# Patient Record
Sex: Female | Born: 1935 | Race: White | Hispanic: No | Smoking: Never smoker
Health system: Southern US, Community
[De-identification: ages and names within clinical notes are randomized; demographics above are authoritative.]

## PROBLEM LIST (undated history)

## (undated) DIAGNOSIS — M1990 Unspecified osteoarthritis, unspecified site: Secondary | ICD-10-CM

## (undated) DIAGNOSIS — E079 Disorder of thyroid, unspecified: Secondary | ICD-10-CM

## (undated) DIAGNOSIS — E7800 Pure hypercholesterolemia, unspecified: Secondary | ICD-10-CM

## (undated) DIAGNOSIS — M549 Dorsalgia, unspecified: Secondary | ICD-10-CM

## (undated) DIAGNOSIS — N390 Urinary tract infection, site not specified: Secondary | ICD-10-CM

## (undated) DIAGNOSIS — I10 Essential (primary) hypertension: Secondary | ICD-10-CM

## (undated) DIAGNOSIS — R102 Pelvic and perineal pain: Secondary | ICD-10-CM

## (undated) HISTORY — DX: Urinary tract infection, site not specified: N39.0

## (undated) HISTORY — DX: Unspecified osteoarthritis, unspecified site: M19.90

## (undated) HISTORY — PX: CHOLECYSTECTOMY: SHX55

## (undated) HISTORY — PX: OTHER SURGICAL HISTORY: SHX169

## (undated) HISTORY — DX: Pelvic and perineal pain: R10.2

## (undated) HISTORY — DX: Dorsalgia, unspecified: M54.9

---

## 2001-12-02 ENCOUNTER — Encounter: Admission: RE | Admit: 2001-12-02 | Discharge: 2002-03-02 | Payer: Self-pay | Admitting: Internal Medicine

## 2002-08-26 ENCOUNTER — Encounter: Payer: Self-pay | Admitting: Hematology and Oncology

## 2002-08-26 ENCOUNTER — Ambulatory Visit (HOSPITAL_COMMUNITY): Admission: RE | Admit: 2002-08-26 | Discharge: 2002-08-26 | Payer: Self-pay | Admitting: Hematology and Oncology

## 2003-06-30 ENCOUNTER — Ambulatory Visit (HOSPITAL_COMMUNITY): Admission: RE | Admit: 2003-06-30 | Discharge: 2003-06-30 | Payer: Self-pay | Admitting: Hematology and Oncology

## 2003-09-17 ENCOUNTER — Ambulatory Visit (HOSPITAL_COMMUNITY): Admission: RE | Admit: 2003-09-17 | Discharge: 2003-09-17 | Payer: Self-pay | Admitting: Internal Medicine

## 2003-10-29 ENCOUNTER — Ambulatory Visit (HOSPITAL_COMMUNITY): Admission: RE | Admit: 2003-10-29 | Discharge: 2003-10-29 | Payer: Self-pay | Admitting: Internal Medicine

## 2004-07-26 ENCOUNTER — Ambulatory Visit (HOSPITAL_COMMUNITY): Admission: RE | Admit: 2004-07-26 | Discharge: 2004-07-26 | Payer: Self-pay | Admitting: Hematology and Oncology

## 2005-02-27 ENCOUNTER — Encounter (HOSPITAL_COMMUNITY): Admission: RE | Admit: 2005-02-27 | Discharge: 2005-03-02 | Payer: Self-pay | Admitting: Oncology

## 2005-02-27 ENCOUNTER — Ambulatory Visit (HOSPITAL_COMMUNITY): Payer: Self-pay | Admitting: Oncology

## 2005-06-11 ENCOUNTER — Ambulatory Visit (HOSPITAL_COMMUNITY): Admission: RE | Admit: 2005-06-11 | Discharge: 2005-06-11 | Payer: Self-pay | Admitting: Family Medicine

## 2005-07-24 ENCOUNTER — Encounter (INDEPENDENT_AMBULATORY_CARE_PROVIDER_SITE_OTHER): Payer: Self-pay | Admitting: Specialist

## 2005-07-25 ENCOUNTER — Inpatient Hospital Stay (HOSPITAL_COMMUNITY): Admission: RE | Admit: 2005-07-25 | Discharge: 2005-08-09 | Payer: Self-pay

## 2005-07-25 ENCOUNTER — Ambulatory Visit: Payer: Self-pay | Admitting: Physical Medicine & Rehabilitation

## 2005-07-30 ENCOUNTER — Encounter (INDEPENDENT_AMBULATORY_CARE_PROVIDER_SITE_OTHER): Payer: Self-pay | Admitting: Cardiology

## 2005-09-26 ENCOUNTER — Encounter: Admission: RE | Admit: 2005-09-26 | Discharge: 2005-09-26 | Payer: Self-pay | Admitting: Oncology

## 2005-09-26 ENCOUNTER — Encounter (HOSPITAL_COMMUNITY): Admission: RE | Admit: 2005-09-26 | Discharge: 2005-10-26 | Payer: Self-pay | Admitting: Oncology

## 2005-10-08 ENCOUNTER — Ambulatory Visit (HOSPITAL_COMMUNITY): Admission: RE | Admit: 2005-10-08 | Discharge: 2005-10-08 | Payer: Self-pay | Admitting: Family Medicine

## 2006-10-09 ENCOUNTER — Encounter (HOSPITAL_COMMUNITY): Admission: RE | Admit: 2006-10-09 | Discharge: 2006-11-08 | Payer: Self-pay | Admitting: Oncology

## 2007-03-10 ENCOUNTER — Ambulatory Visit (HOSPITAL_COMMUNITY): Admission: RE | Admit: 2007-03-10 | Discharge: 2007-03-10 | Payer: Self-pay | Admitting: Family Medicine

## 2007-11-03 ENCOUNTER — Encounter (HOSPITAL_COMMUNITY): Admission: RE | Admit: 2007-11-03 | Discharge: 2007-12-03 | Payer: Self-pay | Admitting: Oncology

## 2007-12-28 IMAGING — US US ABDOMEN COMPLETE
1 series · 14 of 25 positions shown · non-contrast
Comparison: None available.

CLINICAL DATA: Right flank and abdominal pain.
 ABDOMEN ULTRASOUND COMPLETE ? 06/11/05:
TECHNIQUE: Complete abdominal ultrasound examination was performed including evaluation of the liver, gallbladder, bile ducts, pancreas, kidneys, spleen, IVC, and abdominal aorta.

[Series 1: unknown · 0.34mm/px · 14 of 80 slices shown]
[im 1/80]
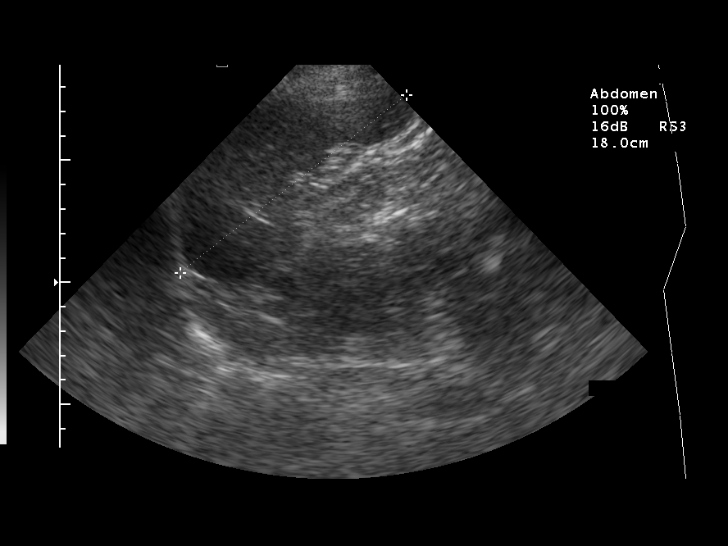
[im 7/80]
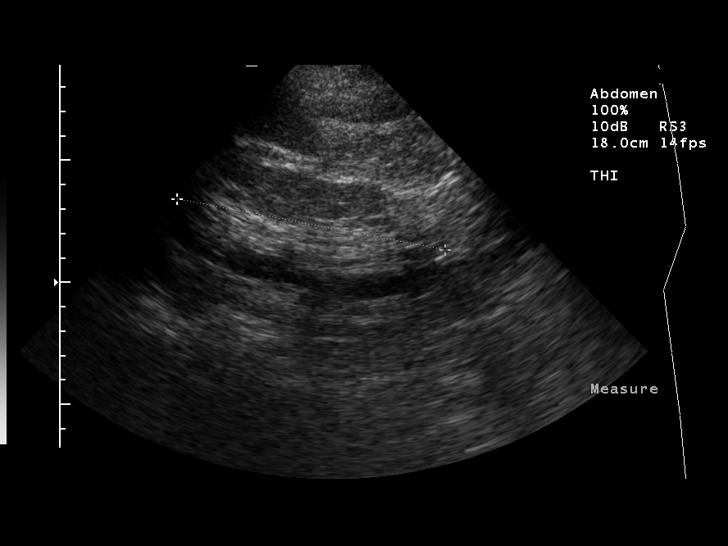
[im 14/80]
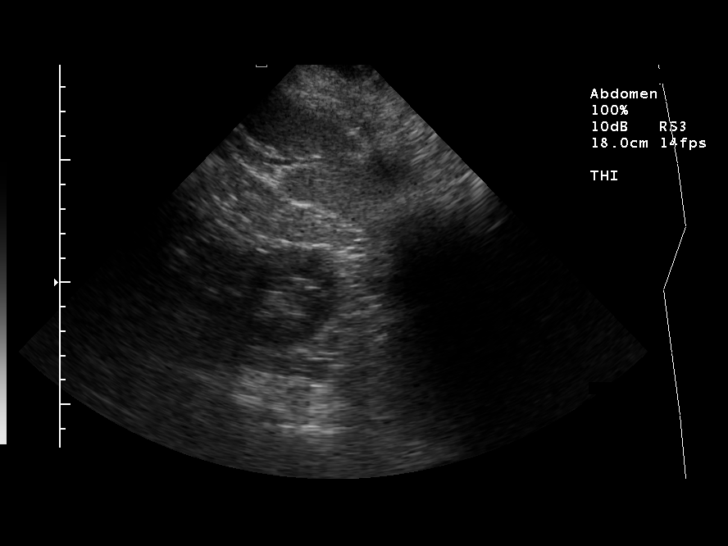
[im 20/80]
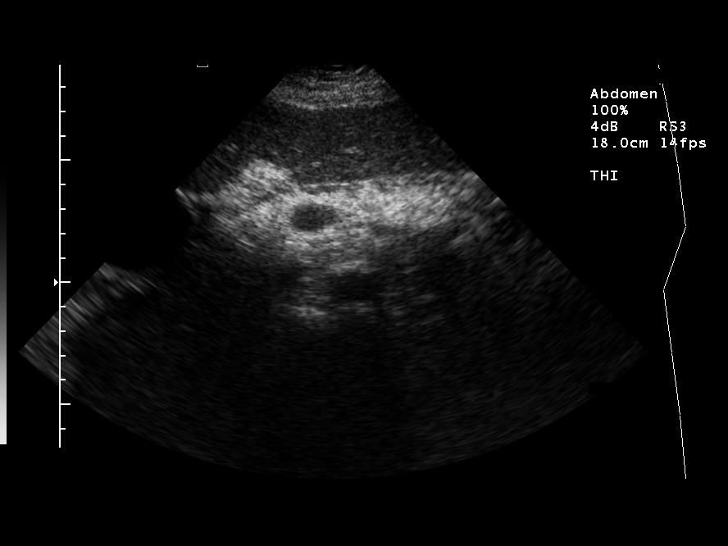
[im 27/80]
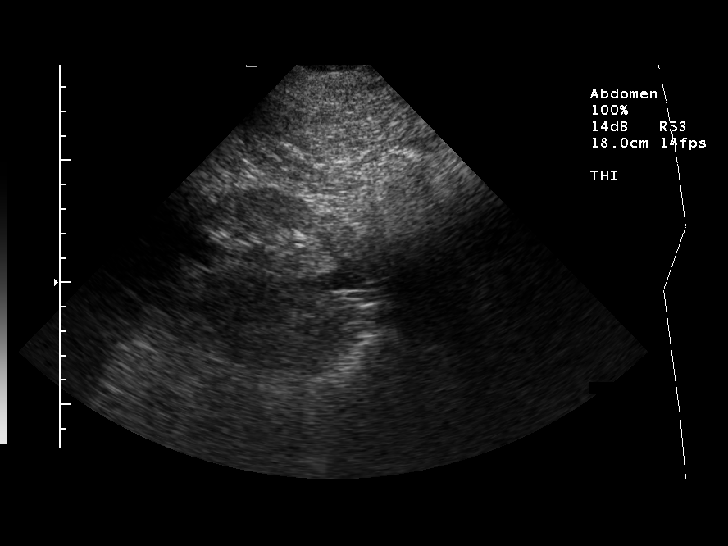
[im 30/80]
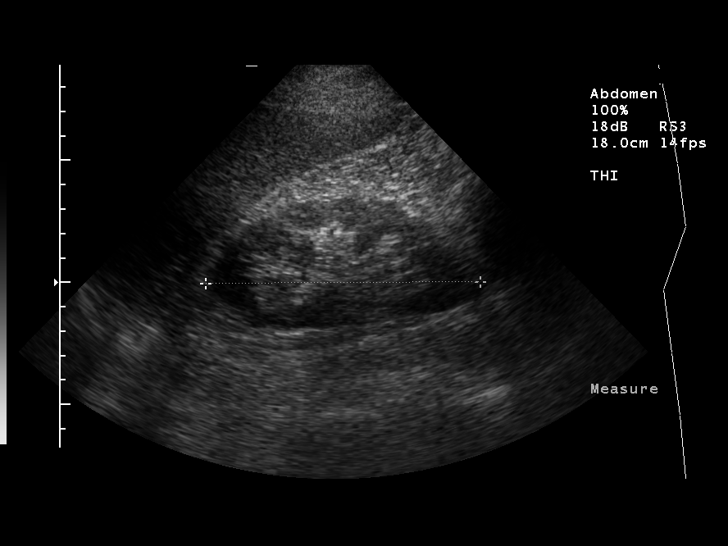
[im 37/80]
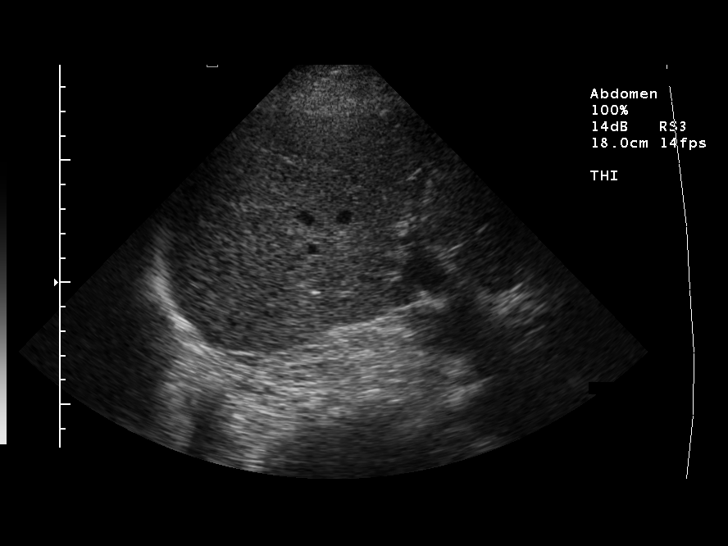
[im 43/80]
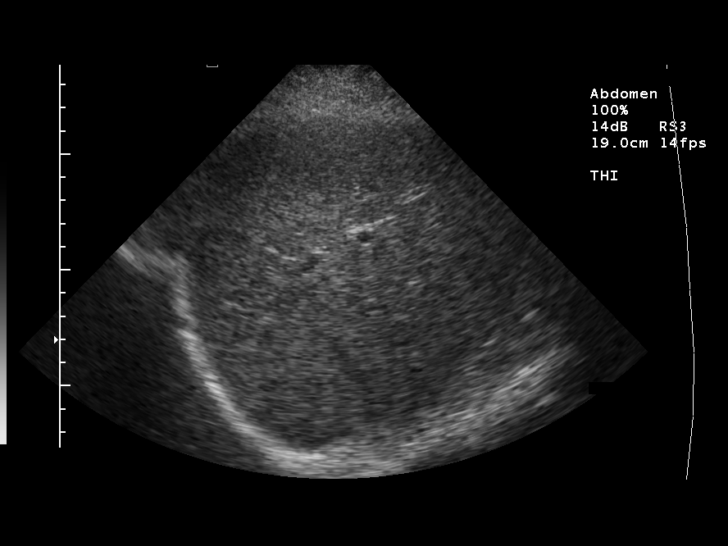
[im 50/80]
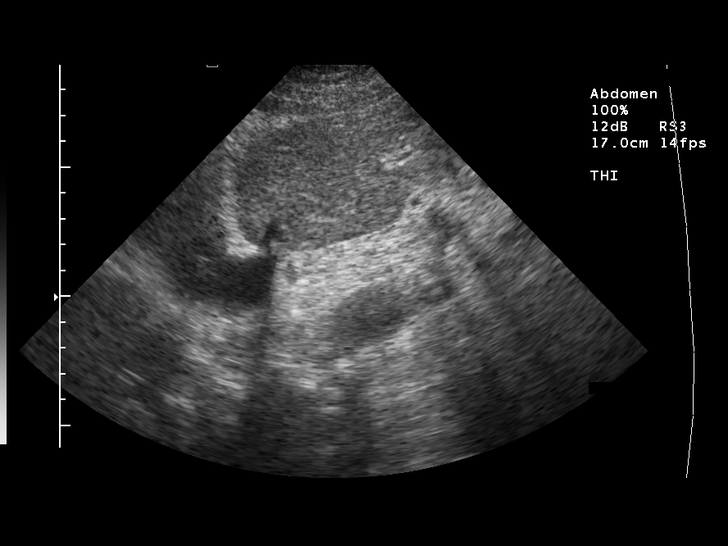
[im 53/80]
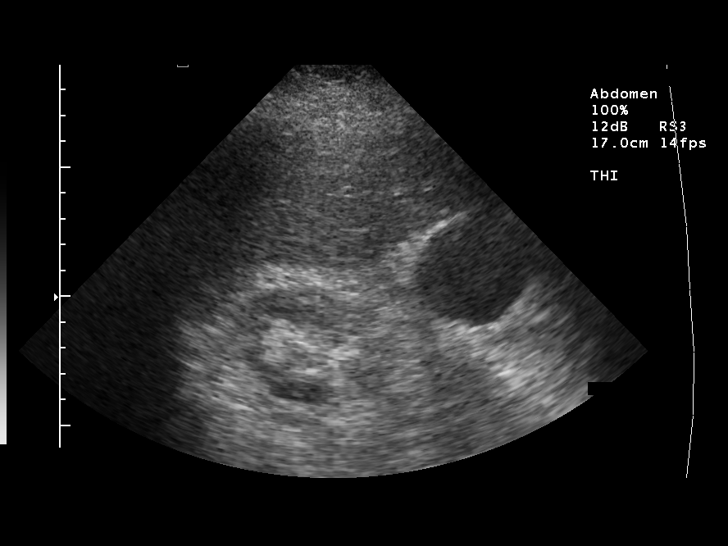
[im 60/80]
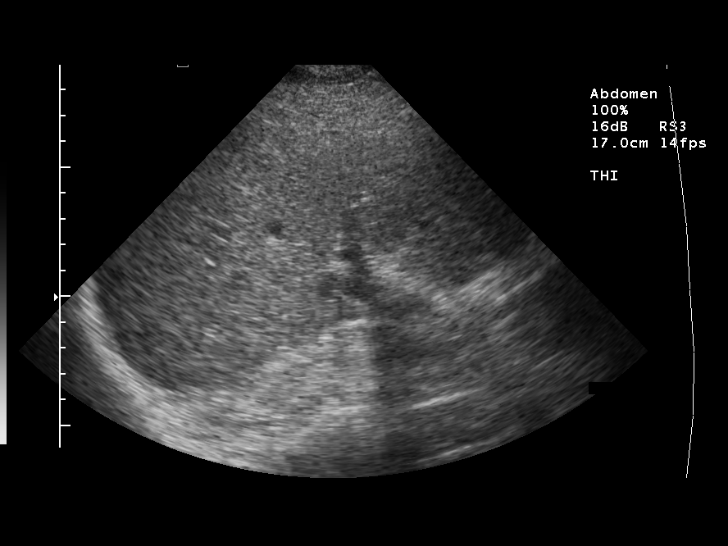
[im 66/80]
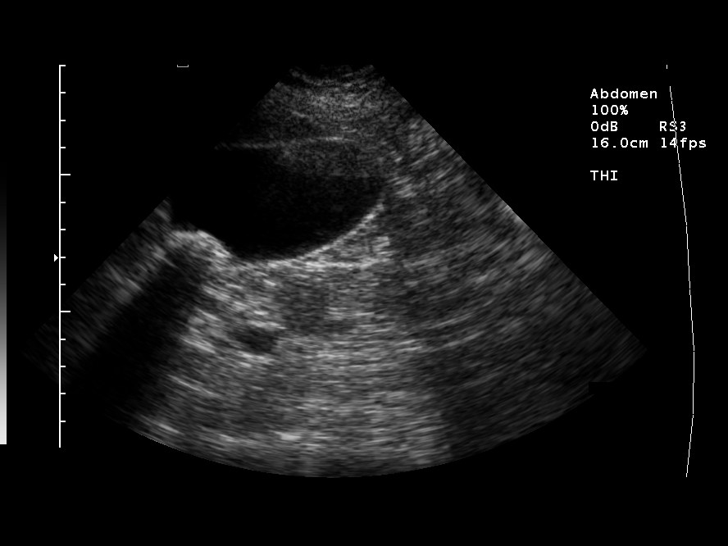
[im 73/80]
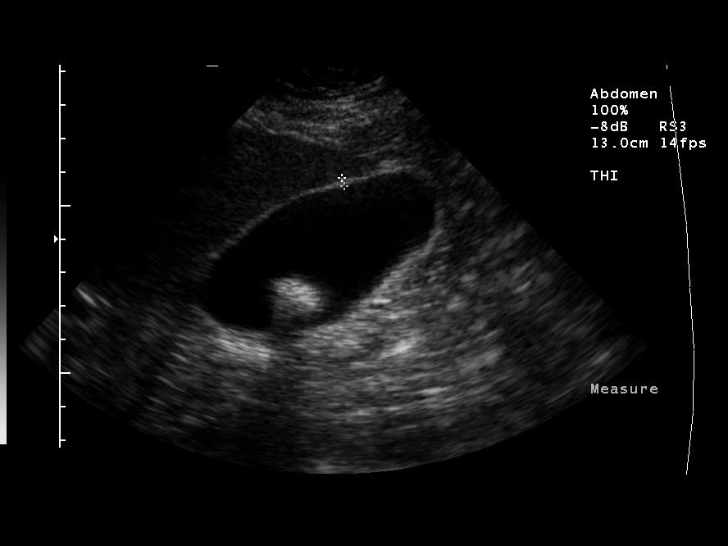
[im 80/80]
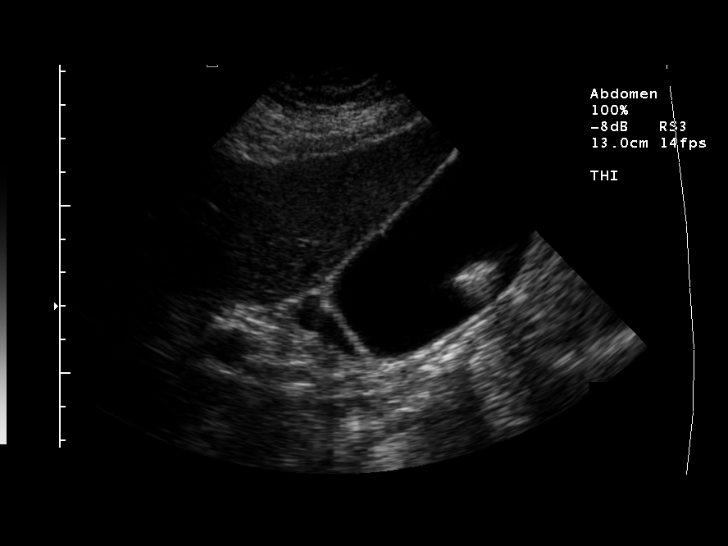

[14 of 25 positions shown; findings below may reference images not displayed]

FINDINGS: The spleen measures 11.8 cm.  The kidneys measure 11.2 cm without mass, stone, or hydronephrosis.  The pancreas, aorta, and IVC are unremarkable.  
 The liver is diffusely increased in attenuation without a focal lesion.  No intra- or extrahepatic biliary duct dilatation with the common bile duct measuring 4 mm.  Mobile, shadowing, echogenic foci are seen within the gallbladder.  No gallbladder wall thickening, pericholecystic fluid, or sonographic Murphy's sign.
IMPRESSION: 1.  Cholelithiasis without acute cholecystitis.
 2.  Fatty liver.

## 2008-01-05 ENCOUNTER — Other Ambulatory Visit: Admission: RE | Admit: 2008-01-05 | Discharge: 2008-01-05 | Payer: Self-pay | Admitting: Obstetrics and Gynecology

## 2008-01-09 ENCOUNTER — Ambulatory Visit (HOSPITAL_COMMUNITY): Admission: RE | Admit: 2008-01-09 | Discharge: 2008-01-09 | Payer: Self-pay | Admitting: Internal Medicine

## 2009-01-06 ENCOUNTER — Ambulatory Visit (HOSPITAL_COMMUNITY): Admission: RE | Admit: 2009-01-06 | Discharge: 2009-01-06 | Payer: Self-pay | Admitting: Family Medicine

## 2010-06-24 ENCOUNTER — Encounter: Payer: Self-pay | Admitting: Internal Medicine

## 2010-06-25 ENCOUNTER — Encounter (HOSPITAL_COMMUNITY): Payer: Self-pay | Admitting: Oncology

## 2010-06-25 ENCOUNTER — Encounter: Payer: Self-pay | Admitting: Internal Medicine

## 2010-10-17 NOTE — Procedures (Signed)
NAME:  Kendra Campos, Kendra Campos NO.:  [id number]   MEDICAL RECORD NO.:  [id number]          PATIENT TYPE:  OUT   LOCATION:  RAD                           FACILITY:  [facility]   PHYSICIAN:  Dani Gobble, MD       DATE OF BIRTH:  Jan 19, 1924   DATE OF PROCEDURE:  03/10/2007  DATE OF DISCHARGE:  03/10/2007                                ECHOCARDIOGRAM   REFERRING PHYSICIAN:  Corrie Mckusick, M.D. and Dani Gobble, MD.   INDICATIONS:  An 77 year old female with a past medical history of  diabetes and hypertension who is experiencing shortness of breath and  lower extremity edema.   Technical quality of the study is quite limited secondary to patient  body habitus and poor acoustic windows.   The aorta measures normally at 2.69 cm.   The left atrium also appears normal in size measured at 3.25 cm.   The interventricular septum and posterior wall within normal limits  other than some mild basal septal hypertrophy without obvious LV OT  obstruction.   The aortic valve itself is not well visualized.  Overall opening appears  reasonable.  There is no suggestion of aortic stenosis by color-flow  Doppler or continuous-wave Doppler.  No aortic insufficiency is  appreciated.   The mitral valve appears grossly structurally normal.  No mitral valve  prolapse is noted,  although suboptimal color-flow Doppler does not  suggest mitral regurgitation.  Doppler interrogation of mitral valve is  within normal limits.   The pulmonic valve is not well visualized.   Tricuspid valve also was poorly visualized.  Mild tricuspid  regurgitation is noted.   The left ventricle is normal in size with LV IDD measured at 4.14 cm and  LV IC measured at 2.61 cm.  Overall left systolic function is normal.  In one view only, there is a subtle suggestion of mild anteroseptal  hypokinesis.  This is not appreciated in any other view.  Overall  ejection fraction again is normal.   Diastolic dysfunction is  inferred from pulse wave Doppler across the  mitral valve.   The right-sided structures are not well visualized but appeared to be  grossly normal.   IMPRESSION:  1. Technically difficult study secondary to patient body habitus and      poor acoustic windows.  2. Mild tricuspid regurgitation.  3. Normal left systolic function and overall ejection fraction.  In      one view only, there is a subtle suggestion of      mild anteroseptal hypokinesis.  However, it is not appreciated in      any other view.  Overall ejection fraction is normal.  4. Presence of diastolic dysfunction is inferred from pulse wave      Doppler across the mitral valve.           ______________________________  Dani Gobble, MD     AB/MEDQ  D:  03/12/2007  T:  03/12/2007  Job:  [id number]   cc:   Corrie Mckusick, M.D.  Fax: [phone]

## 2010-10-20 NOTE — Op Note (Signed)
NAME:  Kendra Campos, Kendra Campos                      ACCOUNT NO.:  [id number]   MEDICAL RECORD NO.:  [id number]                   PATIENT TYPE:  AMB   LOCATION:  DAY                                  FACILITY:  [facility]   PHYSICIAN:  Lionel December, M.D.                 DATE OF BIRTH:  [birth year]   DATE OF PROCEDURE:  DATE OF DISCHARGE:  10/29/2003                                 OPERATIVE REPORT   PROCEDURE:  Esophagogastroduodenoscopy with esophageal dilatation followed  by total colonoscopy with polypectomy.   ENDOSCOPIST:  Lionel December, M.D.   INDICATIONS:  This patient is a 77 year old Caucasian female with a history  of GERD who has solid food dysphagia. Her heartburn and regurgitation is  well controlled with therapy.  She had barium study recently which shows  moderate size hiatal hernia with regurgitation, but no other abnormality was  noted.  She had 2 polyps removed back in 1999 and 1 had high-grade  dysplasia.  She is, therefore, also undergoing surveillance coloscopy.  The  procedure and risks were reviewed with the patient and informed consent was  obtained.   PREOPERATIVE MEDICATIONS:  Cetacaine spray for oropharyngeal topical  anesthesia, Demerol 50 mg IV and Versed 6 mg IV in divided dose.   FINDINGS:  Procedure performed in endoscopy suite.  The patient's vital  signs and O2 saturation were monitored during the procedure and remained  stable.   PROCEDURE #1: ESOPHAGOGASTRODUODENOSCOPY:  The patient was placed in the  left lateral recumbent position and Olympus videoscope was passed via the  oropharynx without any difficulty into the esophagus.   ESOPHAGUS:  Mucosa of the esophagus was normal throughout.  She had Schatzki  ring at GE junction and a 3-to-4-cm size sliding hiatal hernia.  The mucosa  in the herniated part was normal.   STOMACH:  It was empty and distended very well with insufflation.  The folds  of the proximal stomach were normal.  Examination of the  mucosa at body,  antrum, pyloric channel, as well as angularis, fundus, and cardia were  normal.   DUODENUM:  Examination of the bulb and postbulbar duodenum was also normal.  Endoscope was withdrawn.   The esophagus was dilated by passing 56 [place] Maloney dilator to full  insertion.  The ring was still intact and, therefore, disrupted with 4-  quadrant biopsy.  The endoscope was withdrawn.  Patient prepared for  procedure #2.   COLONOSCOPY:  Rectal examination performed.  No abnormality noted on  external or digital exam.   Olympus videoscope was placed in the rectum and advanced under vision into  the sigmoid colon and beyond.  Preparation was satisfactory.  She had  multiple diverticula at sigmoid and descending colon with a few above that.  The scope was passed to the cecum which was identified by appendiceal  orifice and the ileocecal valve.  Pictures were taken for the record.  There  was a single, small polyp at hepatic flexure or proximal transverse colon  which was snared and retrieved for histologic examination.  Mucosa and rest  of the colon was normal.  Rectal mucosa was also normal.   The scope was retroflexed to examine anorectal junction and small  hemorrhoids were noted below the dentate line. The endoscope was  straightened and withdrawn.  The patient tolerated the procedures well.   FINAL DIAGNOSES:  1. Small sliding hiatal hernia with Schatzki ring.  2. Esophagus dilated by passing 56 [place] Maloney dilator.  The ring was     still intact and disrupted with focal biopsy.  3. Pancolonic diverticulosis but most of the diverticula are at sigmoid and     descending colon.  4. A 5-mm polyp snared from hepatic flexure.   RECOMMENDATIONS:  1. Standard instructions given.  2. She will continue antireflux measures and Nexium as before.  3. High fiber diet plus Citrucel or equivalent 1 tablespoonful daily.  4. I will be contacting patient with biopsy  results.      ___________________________________________                                            Lionel December, M.D.   NR/MEDQ  D:  10/29/2003  T:  10/30/2003  Job:  [id number]   cc:   Madelin Rear. Sherwood Gambler, M.D.  [address]  [place]  [place] [address]  Fax: [phone]

## 2010-10-20 NOTE — Op Note (Signed)
NAME:  Kendra Campos, Kendra Campos NO.:  [id number]   MEDICAL RECORD NO.:  [id number]          PATIENT TYPE:  INP   LOCATION:  0161                         [facility]ACILITY:  [facility]   PHYSICIAN:  Gita Kudo, M.D. DATE OF BIRTH:  07-14-1923   DATE OF PROCEDURE:  07/27/2005  DATE OF DISCHARGE:                                 OPERATIVE REPORT   OPERATIVE PROCEDURE:  Explore abdominal wound, reduce incarcerated small  bowel, repair abdominal incisional dehiscence.   SURGEON:  Gita Kudo, M.D.   ASSISTANT:  Lebron Conners, M.D.   ANESTHESIA:  General endotracheal.   PREOPERATIVE DIAGNOSIS:  Probable incisional hernia with incarcerated small  bowel.   POSTOPERATIVE DIAGNOSIS:  Probable incisional hernia with incarcerated small  bowel, secondary to dehiscence of umbilical port site.   OPERATIVE FINDINGS:  There was a loop of small bowel that was incarcerated  into a dehisced umbilical port site. The bowel was viable.   OPERATIVE PROCEDURE:  Under satisfactory general endotracheal anesthesia,  the patient was positioned, prepped and draped in a standard fashion. She  was given 1 gram of cefoxitin preop. The Steri-Strips were removed from her  abdominal incisions and she was prepped and draped in the standard fashion.  The previous transverse incision was extended vertically on either side in  an S-shaped fashion and then great care taken not to injure the bowel which  was readily evident in the subcu. The incision was deepened all around and  then gently I opened the midline fascia exposing the bowel which was viable.  In this manner, the bowel was freed from the incarceration and returned to  the abdominal cavity. The incision was extended superiorly and inferiorly.  Then the bowel was brought out and checked for viability. Moist sponges were  placed against it and it had good color. Then the abdomen was gently  explored manually and there was no evidence of any fluid or  pus. The abdomen  was copiously lavaged with saline. The small bowel contents were milked back  proximally into the stomach and suctioned away. There was about 2.2 liters  of GI contents suctioned out this way which would allow for hopefully  earlier peristalsis and easier closure. Then the incarcerated site was again  checked. It looked as though there was a little ring like band around the  bowel at the site of where the incarceration occurred. Although it was  viable and had peristalsis, I was not really pleased with the color, but  thought it was viable and did not need resection. I therefore inverted this  area with a series of 3-0 silk seromuscular sutures of healthy bowel on  either side of it, imbricating this area but not compromising the lumen of  the intestinal tract. This was approximately one-half the circumference of  this loop of bowel. When finished the bowel looked healthy. It was returned  to the abdomen, more saline used to irrigate and then omentum brought down.  The wound was explored and there was a small supraumbilical hernia that I  cut into and combined it for the repair. Then the midline  was repaired after  trimming the thinned out fascia at the umbilicus. A series of interrupted  figure-of-eight #1 Prolene sutures were used taking generous bites of  healthy fascia. When complete closure was obtained, the subcu was lavaged  with saline. The umbilical skin was quite thin and I elected to  excise it rather than to wait and see if it would be viable. After that was  done, the skin edges were approximated with staples. A sterile absorbent  dressing was then applied and the patient went to the recovery room, then to  go back to the ICU in satisfactory condition.           ______________________________  Gita Kudo, M.D.     MRL/MEDQ  D:  07/27/2005  T:  07/30/2005  Job:  [id number]

## 2010-10-20 NOTE — Op Note (Signed)
NAME:  Kendra Campos, Kendra Campos NO.:  [id number]   MEDICAL RECORD NO.:  [id number]          PATIENT TYPE:  OIB   LOCATION:  1428                         FACILITY:  [facility]   PHYSICIAN:  Lebron Conners, M.D.   DATE OF BIRTH:  03/01/1924   DATE OF PROCEDURE:  07/24/2005  DATE OF DISCHARGE:                                 OPERATIVE REPORT   PREOPERATIVE DIAGNOSIS:  Symptomatic gallstones.   POSTOPERATIVE DIAGNOSIS:  Symptomatic gallstones.   OPERATION:  Laparoscopic cholecystectomy.   SURGEON:  Dr. Orson Slick   ASSISTANT:  Dr. Maryagnes Amos.   ANESTHESIA:  General and local.   BLOOD LOSS:  Minimal.   COMPLICATIONS:  None   SPECIMEN:  Gallbladder.  The patient to PACU in good condition.   PROCEDURE:  After the patient was monitored and had general anesthesia and  routine preparation and draping of the abdomen, I infiltrated local  anesthetic just inferior to the umbilicus and made a short transverse  incision there and incised the midline fascia for about 2 cm.  I then  bluntly entered the peritoneal cavity and found that there were no adhesions  of viscera to that area.  There was a tiny umbilical hernia present with a  very tiny opening which would not admit the end of the finger and no viscera  associated with it.  I did not feel it needed repair.  After placement of  the 0 Vicryl pursestring suture in the fascia and securing a Hassan cannula,  I inflated the abdomen with carbon dioxide and examined the contents.  I saw  no significant adhesions.  The gallbladder was distended but not inflamed.  After placement of 3 additional laparoscopic ports in standard location  under direct view through anesthetized sites, I had the patient positioned  head-up, foot down, and tilted to the left.  I then grasped the fundus of  the gallbladder and elevated it toward the right shoulder and pulled the  infundibulum laterally.  I dissected the hepatoduodenal ligament until I  could clearly  identify the cystic duct emerging from the infundibulum of the  gallbladder and clearly identified the cystic artery traversing the triangle  of Calot.  I clipped the cystic duct with 4 clips and clipped the cystic  artery with 3 clips and cut each between the 2 clips which were closest to  the gallbladder.  I then dissected the gallbladder from the liver utilizing  the cautery and gaining hemostasis with the cautery. The blood loss was  minimal and hemostasis excellent at all times.  After detaching the  gallbladder, I irrigated the operative area and removed the irrigant.  I  removed the gallbladder from the abdomen through the umbilical incision.  It  was difficult to get out. and I opened it, and considerable bile spilled  out, but I did not see any gallstones lost.  There was no spillage of bile  within the abdomen.  After tying the pursestring suture, I irrigated the  wound a little bit more, then removed the remaining irrigant from the right  upper quadrant and removed the 2 lateral  ports under  direct view, noting no bleeding from the abdominal wall.  I then  removed the epigastric port after allowing the carbon dioxide to escape.  The sponge, needle, and instrument counts were correct.  I closed the skin  incisions with intracuticular 4-0 Vicryl and Steri-Strips and applied  bandages.      Lebron Conners, M.D.  Electronically Signed     WB/MEDQ  D:  07/24/2005  T:  07/24/2005  Job:  [id number]   cc:   Patrica Duel, M.D.  Fax: [phone]

## 2010-10-20 NOTE — Discharge Summary (Signed)
NAME:  Kendra Campos, Kendra Campos NO.:  [id number]   MEDICAL RECORD NO.:  [id number]          PATIENT TYPE:  INP   LOCATION:  1315                         FACILITY:  [facility]   PHYSICIAN:  Gita Kudo, M.D. DATE OF BIRTH:  28-Dec-1923   DATE OF ADMISSION:  07/24/2005  DATE OF DISCHARGE:  08/08/2005                                 DISCHARGE SUMMARY   CHIEF COMPLAINT:  Gallstones.   HISTORY OF PRESENT ILLNESS:  This 77 year old female was brought in for  elective cholecystectomy on July 24, 2005. She had been having bouts of  abdominal pain and ultrasound was abnormal. She underwent a laparoscopic  cholecystectomy by Dr. Lebron Conners on February 20 with myself as his  assistant. She had a somewhat complicated postoperative course and  eventually did well.   LABORATORY STUDIES:  EKG did not show anything but atrial fibrillation and a  right bundle branch block. She had an age undetermined lateral infarct and  the EKG was abnormal; however, the changes were chronic. During her  hospitalization she got a transthoracic echogram on February 26 to assess  her left ventricular function while in the intensive care unit. This was  ordered by Dr. Jenne Campus and basically the ventricle functioned vigorously  with an ejection fraction of 75%. She had multiple laboratory studies and  the summary is that her initial hemoglobin 13, hematocrit 37, white count  8400 follow-up after multiple studies on March 4, hemoglobin 11, hematocrit  31, white count 10,400.  Her sodium remained normal throughout.  Potassium  was basically normal, dropped down to 2.91 at one time and required  replacement and follow-up was 3.6. Her serum chemistries were normal except  a mild elevation of a bilirubin at the end of 1.3. Her AST, ALT, and ALT  were all normal. She had CK-MB and CK was normal at 29. The CK-MB was in the  range of 2.1-3.0 and this is the reason for follow up with the echogram. The  report  on the chart states that there could be seen in the angina, CHF,  arrhythmias. Her pre-albumin was low at 8.5, triglycerides elevated at 161.  Pathology showed a gallbladder with cholelithiasis, papillomatosis, and a  tubular adenoma with some non polypoid atypia.  Multiple x-rays were  obtained showing possible small bowel obstruction and she did have a CT scan  that showed small bowel loop at the incision. Her chest x-ray  postoperatively showed no acute disease.   HOSPITAL COURSE:  The patient underwent her laparoscopic cholecystectomy on  the morning of admission and then followed up with routine care. She had  severe amount of nausea and some vomiting. Initially started with  symptomatic treatment and then after the Zofran was ineffective underwent a  CT scan on to February 23. This was the study that showed the incarcerated  umbilical hernia. At that time Dr. Orson Slick relinquished care of the patient  surgically to myself, at the family's request. I explored her with Dr.  Cammie Sickle help on February 23 in the evening and found an incarcerated loop  of small bowel at the umbilical incision. We  extended the incision, ran the  entire small bowel, and this was the only abnormality we found. The bowel  was fortunately viable and we did not have to do any resection. Some  seromuscular sutures were placed in it because there was a little concern of  one area where was incarcerated.   Following this, she was in the intensive care several days. She actually was  able to go to the floor earlier, but there were no beds in the hospital, so  she was maintained in the intensive care. She had a rather prolonged ileus,  some hypokalemia but with time this all passed and on March 4  she was  started on some TNA for a couple of days and this improved and then on March  7, having bowel movements, normal looking incision, she was allowed to go  home from the hospital. There was concern with the family  about where she  would go and we had social services and home health services and physical  therapy evaluate her for rehab versus skilled nursing facilities versus home  care versus living with a relative or a regular nursing home. The family  will decide this at the discharge today.   DISCHARGE DIAGNOSIS:  Abnormal gallbladder with adenoma, cholelithiasis.   OPERATIONS:  1.  February 20, laparoscopic cholecystectomy.  2.  February 23, exploratory laparotomy, repair of abdominal wound      dehiscence at the umbilicus for small bowel obstruction.   COMPLICATIONS:  Postoperative bowel obstruction.   CONDITION ON DISCHARGE:  Good.   CONSULTATIONS:  Cardiology.   Plans for follow-up or in the chart.  She will return to our office.  Medication list reconciled at discharge.           ______________________________  Gita Kudo, M.D.     MRL/MEDQ  D:  08/08/2005  T:  08/08/2005  Job:  [id number]   cc:   Madelin Rear. Sherwood Gambler, MD  Fax: [phone]

## 2010-12-26 ENCOUNTER — Other Ambulatory Visit (HOSPITAL_COMMUNITY)
Admission: RE | Admit: 2010-12-26 | Discharge: 2010-12-26 | Disposition: A | Discharge status: Home / Self Care | Payer: Medicare Other | Source: Ambulatory Visit | Attending: Obstetrics & Gynecology | Admitting: Obstetrics & Gynecology

## 2010-12-26 ENCOUNTER — Other Ambulatory Visit: Payer: Self-pay | Admitting: Obstetrics & Gynecology

## 2010-12-26 DIAGNOSIS — Z124 Encounter for screening for malignant neoplasm of cervix: Secondary | ICD-10-CM | POA: Insufficient documentation

## 2011-05-19 ENCOUNTER — Emergency Department (HOSPITAL_COMMUNITY)
Admission: EM | Admit: 2011-05-19 | Discharge: 2011-05-19 | Disposition: A | Discharge status: Home / Self Care | Payer: Medicare Other | Attending: Emergency Medicine | Admitting: Emergency Medicine

## 2011-05-19 ENCOUNTER — Encounter: Payer: Self-pay | Admitting: Emergency Medicine

## 2011-05-19 DIAGNOSIS — E119 Type 2 diabetes mellitus without complications: Secondary | ICD-10-CM | POA: Insufficient documentation

## 2011-05-19 DIAGNOSIS — K029 Dental caries, unspecified: Secondary | ICD-10-CM | POA: Insufficient documentation

## 2011-05-19 DIAGNOSIS — E7800 Pure hypercholesterolemia, unspecified: Secondary | ICD-10-CM | POA: Insufficient documentation

## 2011-05-19 DIAGNOSIS — I10 Essential (primary) hypertension: Secondary | ICD-10-CM | POA: Insufficient documentation

## 2011-05-19 DIAGNOSIS — K0889 Other specified disorders of teeth and supporting structures: Secondary | ICD-10-CM

## 2011-05-19 DIAGNOSIS — K047 Periapical abscess without sinus: Secondary | ICD-10-CM | POA: Insufficient documentation

## 2011-05-19 HISTORY — DX: Pure hypercholesterolemia, unspecified: E78.00

## 2011-05-19 HISTORY — DX: Disorder of thyroid, unspecified: E07.9

## 2011-05-19 HISTORY — DX: Essential (primary) hypertension: I10

## 2011-05-19 LAB — DIFFERENTIAL
Basophils Absolute: 0 10*3/uL (ref 0.0–0.1)
Eosinophils Absolute: 0.1 10*3/uL (ref 0.0–0.7)
Lymphocytes Relative: 17 % (ref 12–46)
Lymphs Abs: 2 10*3/uL (ref 0.7–4.0)
Monocytes Absolute: 1.1 10*3/uL — ABNORMAL HIGH (ref 0.1–1.0)
Monocytes Relative: 9 % (ref 3–12)

## 2011-05-19 LAB — CBC
HCT: 34.2 % — ABNORMAL LOW (ref 36.0–46.0)
MCH: 30.1 pg (ref 26.0–34.0)
MCHC: 33.9 g/dL (ref 30.0–36.0)
WBC: 12 10*3/uL — ABNORMAL HIGH (ref 4.0–10.5)

## 2011-05-19 LAB — BASIC METABOLIC PANEL
BUN: 19 mg/dL (ref 6–23)
Calcium: 10 mg/dL (ref 8.4–10.5)
GFR calc Af Amer: 58 mL/min — ABNORMAL LOW (ref >90)
Glucose, Bld: 104 mg/dL — ABNORMAL HIGH (ref 70–99)
Sodium: 133 mEq/L — ABNORMAL LOW (ref 135–145)

## 2011-05-19 MED ORDER — HYDROCODONE-ACETAMINOPHEN 5-325 MG PO TABS: ORAL_TABLET | ORAL | Status: AC

## 2011-05-19 MED ORDER — CLINDAMYCIN PHOSPHATE 300 MG/50ML IV SOLN
600.0000 mg | Freq: Once | INTRAVENOUS | Status: AC
  Administered 2011-05-19: 600 mg via INTRAVENOUS
  Filled 2011-05-19 (×2): qty 100

## 2011-05-19 MED ORDER — PENICILLIN V POTASSIUM 500 MG PO TABS: 500.0000 mg | ORAL_TABLET | Freq: Four times a day (QID) | ORAL | Status: AC

## 2011-05-19 NOTE — ED Notes (Signed)
Pt c/o rt facial swelling and mouth pain since Thursday.

## 2011-05-19 NOTE — ED Provider Notes (Signed)
History     CSN: [id number] Arrival date & time: 05/19/2011 10:14 AM   First MD Initiated Contact with Patient 05/19/11 1113      Chief Complaint  Patient presents with  . Facial Swelling  . Dental Pain    (Consider location/radiation/quality/duration/timing/severity/associated sxs/prior treatment) HPI Comments: Patient c/o toothache and facial swelling with erythema to the right face for two days.  States the redness is improving.  Sx;'s began with pain and swelling of the gum surrounding one of the right upper premolars.  She denies fever, vomiting, difficulty swallowing or neck pain.  Staes she tried to see her dentist on Friday, but he was not in his office.    Patient is a 77 y.o. female presenting with tooth pain. The history is provided by the patient and a relative.  Dental PainThe primary symptoms include mouth pain. Primary symptoms do not include oral bleeding, headaches, fever, shortness of breath, sore throat or angioedema. The symptoms began 2 days ago. The symptoms are worsening. The symptoms are new. The symptoms occur constantly.  Mouth pain began 24 -48 hours ago. Mouth pain occurs constantly. Mouth pain is worsening. Affected locations include: teeth and gum(s).  Additional symptoms include: gum swelling, gum tenderness and facial swelling. Additional symptoms do not include: dental sensitivity to temperature, trismus, trouble swallowing, pain with swallowing, drooling, swollen glands and fatigue.    Past Medical History  Diagnosis Date  . Diabetes mellitus   . Hypertension   . Thyroid disease   . Hypercholesteremia     Past Surgical History  Procedure Date  . Cholecystectomy   . Gall stones     History reviewed. No pertinent family history.  History  Substance Use Topics  . Smoking status: Not on file  . Smokeless tobacco: Not on file  . Alcohol Use: No    OB History    Grav Para Term Preterm Abortions TAB SAB Ect Mult Living                   Review of Systems  Constitutional: Negative for fever, chills, activity change, appetite change and fatigue.  HENT: Positive for facial swelling and dental problem. Negative for congestion, sore throat, drooling, trouble swallowing, neck pain, neck stiffness and tinnitus.   Respiratory: Negative for shortness of breath.   Cardiovascular: Negative for chest pain and palpitations.  Gastrointestinal: Negative for nausea, vomiting and abdominal pain.  Musculoskeletal: Negative for myalgias, back pain and arthralgias.  Skin: Positive for color change. Negative for rash and wound.  Neurological: Negative for dizziness, weakness, numbness and headaches.  Hematological: Negative for adenopathy. Does not bruise/bleed easily.  All other systems reviewed and are negative.    Allergies  Review of patient's allergies indicates no known allergies.  Home Medications   Current Outpatient Rx  Name Route Sig Dispense Refill  . CALCIUM-VITAMIN D PO Oral Take 1 tablet by mouth daily. Unknown strength     . VITAMIN B-12 PO Oral Take 1 tablet by mouth daily. Unknown strength      . OMEGA-3 FATTY ACIDS 1000 MG PO CAPS Oral Take 1 g by mouth daily.      . ADULT MULTIVITAMIN W/MINERALS CH Oral Take 1 tablet by mouth daily.      Marland Kitchen PRESCRIPTION MEDICATION Oral Take 1 tablet by mouth daily. Patient takes a thyroid medication, cholesterol medication, and a fluid medication. 1 tablet of each daily. Unknown name or strengths.     Marland Kitchen DIOVAN PO Oral Take 1  tablet by mouth daily.        BP 125/50  Pulse 64  Temp(Src) 97.6 F (36.4 C) (Oral)  Resp 20  Ht 5\' 1"  (1.549 m)  Wt 215 lb (97.523 kg)  BMI 40.62 kg/m2  SpO2 100%  Physical Exam  Nursing note and vitals reviewed. Constitutional: She is oriented to person, place, and time. She appears well-developed and well-nourished. No distress.  HENT:  Head: Normocephalic and atraumatic. No trismus in the jaw.    Right Ear: Tympanic membrane and ear canal  normal.  Left Ear: Tympanic membrane and ear canal normal.  Mouth/Throat: Uvula is midline, oropharynx is clear and moist and mucous membranes are normal. Dental abscesses and dental caries present. No uvula swelling.    Eyes: Conjunctivae and EOM are normal. Pupils are equal, round, and reactive to light.  Neck: Normal range of motion. Neck supple.  Cardiovascular: Normal rate, regular rhythm and normal heart sounds.   Pulmonary/Chest: Effort normal and breath sounds normal. No respiratory distress. She exhibits no tenderness.  Abdominal: Soft. She exhibits no distension. There is no tenderness.  Musculoskeletal: Normal range of motion. She exhibits no tenderness.  Lymphadenopathy:    She has no cervical adenopathy.  Neurological: She is alert and oriented to person, place, and time. No cranial nerve deficit. She exhibits normal muscle tone. Coordination normal.  Skin: Skin is warm and dry.  Psychiatric: She has a normal mood and affect.    ED Course  Procedures (including critical care time)  Labs Reviewed  CBC - Abnormal; Notable for the following:    WBC 12.0 (*)    RBC 3.85 (*)    Hemoglobin 11.6 (*)    HCT 34.2 (*)    All other components within normal limits  DIFFERENTIAL - Abnormal; Notable for the following:    Neutro Abs 8.8 (*)    Monocytes Absolute 1.1 (*)    All other components within normal limits  BASIC METABOLIC PANEL - Abnormal; Notable for the following:    Sodium 133 (*)    Glucose, Bld 104 (*)    GFR calc non Af Amer 50 (*)    GFR calc Af Amer 58 (*)    All other components within normal limits        MDM    1:35 PM patient has received IV clinda, remains stable. Has dental caries to the right upper lateral incisor and premolar.  Erythema of the face is likley related to dental abscess.  I have discussed patient hx and care plan with EDP.  SHe is feeling better,  NAD.  Has ate snack and drank fluids.  Airway remains patent.  Mild to moderate  swelling and erythema of the right face from the periorbital region to the mid neck.  Will prescribe PCN and pain medication.  I have advised her to follow-up with her dentist on Monday or return here if sx's worsen.  Pt feels improved after observation and/or treatment in ED.   Patient / Family / Caregiver understand and agree with initial ED impression and plan with expectations set for ED visit.             Jeena Arnett L. Plum, [place] 05/20/11 2123

## 2011-05-19 NOTE — ED Notes (Signed)
Pt has swelling and redness to right side of face. Pts right eye also has swelling.

## 2011-05-21 NOTE — ED Provider Notes (Signed)
Medical screening examination/treatment/procedure(s) were performed by non-physician practitioner and as supervising physician I was immediately available for consultation/collaboration.   Hadlyn Amero M Tyshana Nishida, DO 05/21/11 0723 

## 2011-07-30 ENCOUNTER — Other Ambulatory Visit (HOSPITAL_COMMUNITY): Payer: Self-pay | Admitting: Family Medicine

## 2011-07-30 DIAGNOSIS — Z139 Encounter for screening, unspecified: Secondary | ICD-10-CM

## 2011-07-30 DIAGNOSIS — G8929 Other chronic pain: Secondary | ICD-10-CM

## 2011-08-06 ENCOUNTER — Ambulatory Visit (HOSPITAL_COMMUNITY)
Admission: RE | Admit: 2011-08-06 | Discharge: 2011-08-06 | Disposition: A | Discharge status: Home / Self Care | Payer: Medicare Other | Source: Ambulatory Visit | Attending: Family Medicine | Admitting: Family Medicine

## 2011-08-06 DIAGNOSIS — Z13820 Encounter for screening for osteoporosis: Secondary | ICD-10-CM | POA: Insufficient documentation

## 2011-08-06 DIAGNOSIS — G8929 Other chronic pain: Secondary | ICD-10-CM | POA: Insufficient documentation

## 2011-08-06 DIAGNOSIS — Z139 Encounter for screening, unspecified: Secondary | ICD-10-CM

## 2011-12-31 ENCOUNTER — Encounter: Payer: Self-pay | Admitting: Cardiology

## 2012-06-09 ENCOUNTER — Ambulatory Visit (HOSPITAL_COMMUNITY): Payer: Medicare Other

## 2012-06-09 ENCOUNTER — Encounter (INDEPENDENT_AMBULATORY_CARE_PROVIDER_SITE_OTHER): Payer: Self-pay | Admitting: Internal Medicine

## 2012-06-09 ENCOUNTER — Ambulatory Visit (INDEPENDENT_AMBULATORY_CARE_PROVIDER_SITE_OTHER): Payer: Medicare Other | Admitting: Internal Medicine

## 2012-06-09 ENCOUNTER — Ambulatory Visit (HOSPITAL_COMMUNITY)
Admission: RE | Admit: 2012-06-09 | Discharge: 2012-06-09 | Disposition: A | Discharge status: Home / Self Care | Payer: PRIVATE HEALTH INSURANCE | Source: Ambulatory Visit | Attending: Cardiovascular Disease | Admitting: Cardiovascular Disease

## 2012-06-09 VITALS — BP 120/66 | HR 64 | Temp 97.6°F | Ht 64.0 in | Wt 205.4 lb

## 2012-06-09 DIAGNOSIS — R011 Cardiac murmur, unspecified: Secondary | ICD-10-CM | POA: Insufficient documentation

## 2012-06-09 DIAGNOSIS — K219 Gastro-esophageal reflux disease without esophagitis: Secondary | ICD-10-CM

## 2012-06-09 NOTE — Patient Instructions (Addendum)
Continue PPI. F/u prn.

## 2012-06-09 NOTE — Progress Notes (Signed)
Subjective:     Patient ID: Kendra Campos, female   DOB: 24-Dec-1923, 79 y.o.   MRN: [id number]  HPI Referred to our office by Dr. Phillips Odor for GERD. She tells me she occasionally has acid reflux. She was given ?medication for acid reflux. She tells me she has acid reflux occasionally. She will have acid reflux if she eats spicy foods or drinks to much water. Appetite good. Weight loss of 10 pounds which was intentional.  She has a BM about once day. No melena or bright red rectal bleeding.  EGD/ED and colonoscopy 5/ 2005 Dr. Rehman:FINAL DIAGNOSES:  1. Small sliding hiatal hernia with Schatzki ring.  2. Esophagus dilated by passing 56 [place] Maloney dilator. The ring was  still intact and disrupted with focal biopsy.  3. Pancolonic diverticulosis but most of the diverticula are at sigmoid and  descending colon.  4. A 5-mm polyp snared from hepatic flexure.   05/20/2012 H and H 11.0 and 32.3, MCV 86.4, ALP 74, AST 20, ALT 15, Total protein 6.3, Albumin 4.1  Review of Systems see hpi Current Outpatient Prescriptions  Medication Sig Dispense Refill  . CALCIUM-VITAMIN D PO Take 1 tablet by mouth daily. Unknown strength       . co-enzyme Q-10 30 MG capsule Take 30 mg by mouth 2 (two) times daily.      . Cyanocobalamin (VITAMIN B-12 PO) Take 1 tablet by mouth daily. Unknown strength        . fish oil-omega-3 fatty acids 1000 MG capsule Take 1 g by mouth daily.        . Levothyroxine Sodium (SYNTHROID PO) Take 25 mcg by mouth.      . Multiple Vitamin (MULITIVITAMIN WITH MINERALS) TABS Take 1 tablet by mouth daily.        Marland Kitchen PRESCRIPTION MEDICATION Take 1 tablet by mouth daily. Patient takes a thyroid medication, cholesterol medication, and a fluid medication. 1 tablet of each daily. Unknown name or strengths.       . Valsartan (DIOVAN PO) Take 1 tablet by mouth daily.         Past Medical History  Diagnosis Date  . Diabetes mellitus   . Hypertension   . Thyroid disease   .  Hypercholesteremia    Past Surgical History  Procedure Date  . Cholecystectomy   . Gall stones    No Known Allergies      Objective:   Physical ExamThere were no vitals filed for this visit. Filed Vitals:   06/09/12 1551  BP: 120/66  Pulse: 64  Temp: 97.6 F (36.4 C)  Height: 5\' 4"  (1.626 m)  Weight: 205 lb 6.4 oz (93.169 kg)    Alert and oriented. Skin warm and dry. Oral mucosa is moist.   . Sclera anicteric, conjunctivae is pink. Thyroid not enlarged. No cervical lymphadenopathy. Lungs clear. Heart regular rate and rhythm.  Abdomen is soft. Bowel sounds are positive. No hepatomegaly. No abdominal masses felt. No tenderness.  No edema to lower extremities.        Assessment:   GERD controlled at this time. No nausea at this time. PPI she was placed on by Dr Alanda Amass is controlling her acid reflux.    Plan:    May follow up on a prn basis.

## 2012-06-09 NOTE — Progress Notes (Signed)
*  PRELIMINARY RESULTS* Echocardiogram 2D Echocardiogram has been performed.  Nestor Ramp M 06/09/2012, 7:25 PM

## 2012-07-08 ENCOUNTER — Encounter (INDEPENDENT_AMBULATORY_CARE_PROVIDER_SITE_OTHER): Payer: Self-pay

## 2012-10-17 ENCOUNTER — Other Ambulatory Visit: Payer: Self-pay | Admitting: Cardiovascular Disease

## 2012-10-17 LAB — CBC WITH DIFFERENTIAL/PLATELET
Eosinophils Absolute: 0.2 10*3/uL (ref 0.0–0.7)
Eosinophils Relative: 2 % (ref 0–5)
HCT: 32.3 % — ABNORMAL LOW (ref 36.0–46.0)
Hemoglobin: 11.1 g/dL — ABNORMAL LOW (ref 12.0–15.0)
Lymphs Abs: 2.3 10*3/uL (ref 0.7–4.0)
MCH: 29.1 pg (ref 26.0–34.0)
MCV: 84.8 fL (ref 78.0–100.0)
Monocytes Absolute: 0.9 10*3/uL (ref 0.1–1.0)
Monocytes Relative: 11 % (ref 3–12)
RBC: 3.81 MIL/uL — ABNORMAL LOW (ref 3.87–5.11)

## 2012-10-17 LAB — COMPREHENSIVE METABOLIC PANEL
CO2: 27 mEq/L (ref 19–32)
Creat: 1.09 mg/dL (ref 0.50–1.10)
Glucose, Bld: 94 mg/dL (ref 70–99)
Sodium: 135 mEq/L (ref 135–145)
Total Bilirubin: 0.6 mg/dL (ref 0.3–1.2)
Total Protein: 6.3 g/dL (ref 6.0–8.3)

## 2012-10-17 LAB — LIPID PANEL
Cholesterol: 126 mg/dL (ref 0–200)
Triglycerides: 79 mg/dL (ref <150)
VLDL: 16 mg/dL (ref 0–40)

## 2013-02-23 ENCOUNTER — Telehealth: Payer: Self-pay | Admitting: Cardiovascular Disease

## 2013-02-23 NOTE — Telephone Encounter (Signed)
Please have Dr Alanda Amass recommend her a doctor.she lives in [place],

## 2013-03-03 NOTE — Telephone Encounter (Signed)
Pt. Informed that she would be put with a cardiologist that had availability. Pt. Stated understanding

## 2013-04-20 ENCOUNTER — Ambulatory Visit: Payer: PRIVATE HEALTH INSURANCE | Admitting: Cardiology

## 2013-04-27 ENCOUNTER — Ambulatory Visit: Payer: PRIVATE HEALTH INSURANCE | Admitting: Cardiology

## 2013-07-06 ENCOUNTER — Ambulatory Visit (HOSPITAL_COMMUNITY)
Admission: RE | Admit: 2013-07-06 | Discharge: 2013-07-06 | Disposition: A | Discharge status: Home / Self Care | Payer: PRIVATE HEALTH INSURANCE | Source: Ambulatory Visit | Attending: Family Medicine | Admitting: Family Medicine

## 2013-07-06 ENCOUNTER — Other Ambulatory Visit (HOSPITAL_COMMUNITY): Payer: Self-pay | Admitting: Family Medicine

## 2013-07-06 DIAGNOSIS — M5380 Other specified dorsopathies, site unspecified: Secondary | ICD-10-CM | POA: Insufficient documentation

## 2013-07-06 DIAGNOSIS — I700 Atherosclerosis of aorta: Secondary | ICD-10-CM | POA: Insufficient documentation

## 2013-07-06 DIAGNOSIS — Q762 Congenital spondylolisthesis: Secondary | ICD-10-CM | POA: Insufficient documentation

## 2013-07-06 DIAGNOSIS — M5137 Other intervertebral disc degeneration, lumbosacral region: Secondary | ICD-10-CM

## 2013-07-07 ENCOUNTER — Other Ambulatory Visit (HOSPITAL_COMMUNITY): Payer: Self-pay | Admitting: Family Medicine

## 2013-07-07 DIAGNOSIS — M5137 Other intervertebral disc degeneration, lumbosacral region: Secondary | ICD-10-CM

## 2013-07-08 ENCOUNTER — Ambulatory Visit (HOSPITAL_COMMUNITY)
Admission: RE | Admit: 2013-07-08 | Discharge: 2013-07-08 | Disposition: A | Discharge status: Home / Self Care | Payer: Medicare Other | Source: Ambulatory Visit | Attending: Family Medicine | Admitting: Family Medicine

## 2013-07-08 DIAGNOSIS — M5137 Other intervertebral disc degeneration, lumbosacral region: Secondary | ICD-10-CM | POA: Insufficient documentation

## 2013-07-08 DIAGNOSIS — M47817 Spondylosis without myelopathy or radiculopathy, lumbosacral region: Secondary | ICD-10-CM | POA: Insufficient documentation

## 2013-07-08 DIAGNOSIS — M51379 Other intervertebral disc degeneration, lumbosacral region without mention of lumbar back pain or lower extremity pain: Secondary | ICD-10-CM | POA: Insufficient documentation

## 2013-07-08 DIAGNOSIS — R29898 Other symptoms and signs involving the musculoskeletal system: Secondary | ICD-10-CM | POA: Insufficient documentation

## 2013-07-08 DIAGNOSIS — M48061 Spinal stenosis, lumbar region without neurogenic claudication: Secondary | ICD-10-CM | POA: Insufficient documentation

## 2013-07-08 DIAGNOSIS — M545 Low back pain, unspecified: Secondary | ICD-10-CM | POA: Insufficient documentation

## 2013-08-07 ENCOUNTER — Ambulatory Visit (INDEPENDENT_AMBULATORY_CARE_PROVIDER_SITE_OTHER): Payer: PRIVATE HEALTH INSURANCE | Admitting: Cardiovascular Disease

## 2013-08-07 ENCOUNTER — Encounter: Payer: Self-pay | Admitting: Cardiovascular Disease

## 2013-08-07 VITALS — BP 140/62 | HR 60 | Ht 63.0 in | Wt 194.0 lb

## 2013-08-07 DIAGNOSIS — I10 Essential (primary) hypertension: Secondary | ICD-10-CM

## 2013-08-07 DIAGNOSIS — E785 Hyperlipidemia, unspecified: Secondary | ICD-10-CM

## 2013-08-07 NOTE — Assessment & Plan Note (Signed)
On Fish oil 

## 2013-08-07 NOTE — Patient Instructions (Signed)
Your physician wants you to follow-up in: 1 year with an extender and 2 year with Dr Allyson SabalBerry. You will receive a reminder letter in the mail two months in advance. If you don't receive a letter, please call our office to schedule the follow-up appointment.

## 2013-08-07 NOTE — Assessment & Plan Note (Signed)
Blood pressure was well controlled on current medications

## 2013-08-07 NOTE — Progress Notes (Signed)
08/07/2013 Kendra Campos   06/10/1923  [id number]  Primary Physician Colette RibasGOLDING, JOHN CABOT, MD Primary Cardiologist: Runell GessJonathan J. Jaxie Racanelli MD Roseanne RenoFACP,FACC,FAHA, FSCAI   HPI:  Ms. Kendra Campos is a 80 year old mildly overweight widowed Caucasian female mother of 2 children formerly a patient of Dr. Rocco Sereneichard Weintraub's. I'm assuming her care. Her only real cardiovascular issues are hypertension and hyperlipidemia. She has never cardiac problems. She's never had a heart attack or stroke. She is no evidence of coronary disease and had a negative Myoview stress test 08/09/08. She is completely asymptomatic.   Current Outpatient Prescriptions  Medication Sig Dispense Refill  . CALCIUM-VITAMIN D PO Take 1 tablet by mouth daily. Unknown strength       . co-enzyme Q-10 30 MG capsule Take 30 mg by mouth 2 (two) times daily.      . Cyanocobalamin (VITAMIN B-12 PO) Take 1 tablet by mouth daily. Unknown strength        . fish oil-omega-3 fatty acids 1000 MG capsule Take 1 g by mouth daily.        . furosemide (LASIX) 20 MG tablet Take 20 mg by mouth 3 (three) times daily.      . Levothyroxine Sodium (SYNTHROID PO) Take 25 mcg by mouth.      . Multiple Vitamin (MULITIVITAMIN WITH MINERALS) TABS Take 1 tablet by mouth daily.        . traMADol (ULTRAM) 50 MG tablet Take 50 mg by mouth every 6 (six) hours as needed.      . Valsartan (DIOVAN PO) Take 1 tablet by mouth daily.        Marland Kitchen. zolpidem (AMBIEN) 5 MG tablet Take 10 mg by mouth at bedtime as needed for sleep.      Marland Kitchen. PRESCRIPTION MEDICATION Take 1 tablet by mouth daily. Patient takes a thyroid medication, cholesterol medication, and a fluid medication. 1 tablet of each daily. Unknown name or strengths.        No current facility-administered medications for this visit.    No Known Allergies  History   Social History  . Marital Status: Widowed    Spouse Name: N/A    Number of Children: N/A  . Years of Education: N/A   Occupational History  .  Not on file.   Social History Main Topics  . Smoking status: Never Smoker   . Smokeless tobacco: Not on file  . Alcohol Use: No  . Drug Use: No  . Sexual Activity: Not on file   Other Topics Concern  . Not on file   Social History Narrative  . No narrative on file     Review of Systems: General: negative for chills, fever, night sweats or weight changes.  Cardiovascular: negative for chest pain, dyspnea on exertion, edema, orthopnea, palpitations, paroxysmal nocturnal dyspnea or shortness of breath Dermatological: negative for rash Respiratory: negative for cough or wheezing Urologic: negative for hematuria Abdominal: negative for nausea, vomiting, diarrhea, bright red blood per rectum, melena, or hematemesis Neurologic: negative for visual changes, syncope, or dizziness All other systems reviewed and are otherwise negative except as noted above.    Blood pressure 140/62, pulse 60, height 5\' 3"  (1.6 m), weight 87.998 kg (194 lb).  General appearance: alert and no distress Neck: no adenopathy, no carotid bruit, no JVD, supple, symmetrical, trachea midline and thyroid not enlarged, symmetric, no tenderness/mass/nodules Lungs: clear to auscultation bilaterally Heart: regular rate and rhythm, S1, S2 normal, no murmur, click, rub or gallop Extremities: extremities normal,  atraumatic, no cyanosis or edema  EKG normal sinus rhythm at 60 with supplements block and left axis deviation  ASSESSMENT AND PLAN:   Essential hypertension Blood pressure was well controlled on current medications  Hyperlipidemia On Fish oil      Runell Gess MD [facility], [facility] 08/07/2013 4:37 PM

## 2013-11-09 ENCOUNTER — Other Ambulatory Visit: Payer: Self-pay

## 2013-11-09 MED ORDER — VALSARTAN 40 MG PO TABS: 40.0000 mg | ORAL_TABLET | Freq: Every day | ORAL | Status: DC

## 2013-11-09 NOTE — Telephone Encounter (Signed)
Rx was sent to pharmacy electronically. 

## 2014-09-06 ENCOUNTER — Other Ambulatory Visit: Payer: Self-pay | Admitting: Cardiovascular Disease

## 2014-09-06 NOTE — Telephone Encounter (Signed)
Rx(s) sent to pharmacy electronically.  

## 2014-10-06 ENCOUNTER — Other Ambulatory Visit: Payer: Self-pay | Admitting: Cardiovascular Disease

## 2014-10-06 NOTE — Telephone Encounter (Signed)
Rx(s) sent to pharmacy electronically.  

## 2015-07-15 ENCOUNTER — Ambulatory Visit (INDEPENDENT_AMBULATORY_CARE_PROVIDER_SITE_OTHER): Payer: Medicare Other | Admitting: Adult Health

## 2015-07-15 ENCOUNTER — Encounter: Payer: Self-pay | Admitting: Adult Health

## 2015-07-15 VITALS — BP 144/70 | HR 64 | Wt 175.0 lb

## 2015-07-15 DIAGNOSIS — R102 Pelvic and perineal pain: Secondary | ICD-10-CM | POA: Diagnosis not present

## 2015-07-15 DIAGNOSIS — N9489 Other specified conditions associated with female genital organs and menstrual cycle: Secondary | ICD-10-CM

## 2015-07-15 HISTORY — DX: Pelvic and perineal pain: R10.2

## 2015-07-15 NOTE — Patient Instructions (Signed)
Return in 1 week for US  

## 2015-07-15 NOTE — Progress Notes (Signed)
Subjective:     Patient ID: Kendra Campos, female   DOB: August 14, 1923, 82 y.o.   MRN: [id number]  HPI [name] is a 82 year old white female, who walks with a walker in complaining of having recent UTI and now feels like something falling out.She says she leaks urine and is up at night every 2 hours to pee. She still plays cards every week except when back is out. She has brace right lower leg.She says something white and brown and mucous like feel out of vagina., ? Blood in it. PCP is Dr Phillips Odor.   Review of Systems Patient denies any headaches, hearing loss, fatigue, blurred vision, shortness of breath, chest pain, abdominal pain, problems with bowel movements,  or intercourse(not having sex). No  mood swings. See HPI for positives Reviewed past medical,surgical, social and family history. Reviewed medications and allergies.     Objective:   Physical Exam BP 144/70 mmHg  Pulse 64  Wt 175 lb (79.379 kg) Skin warm and dry.Pelvic: external genitalia is normal in appearance for age, no lesions, vagina: atrophic,urethra has no lesions or masses noted, cervix is [poor seen, uterus: normal size, shape and contour, mildly tender, no masses felt, adnexa: no masses, LLQ tenderness noted. Bladder is non tender and no masses felt. Difficult exam due to abdominal girth and her ability to position legs.Abdomen soft and mildly tender, has ? Small ventral hernia to left of abdominal  Incision, no HSM noted. Discussed with pt and daughter has fairly good support, but is atrophic and since tender will get [place] and they agree.    Assessment:     Pelvic pressure Pelvic pain    Plan:     Get urine at home and bring back for UA C&S Return in 1 week for gyn [place]

## 2015-07-16 LAB — MICROSCOPIC EXAMINATION
Casts: NONE SEEN /lpf
WBC, UA: >30 /hpf — AB (ref 0–?)

## 2015-07-16 LAB — URINALYSIS, ROUTINE W REFLEX MICROSCOPIC
Bilirubin, UA: NEGATIVE
GLUCOSE, UA: NEGATIVE
KETONES UA: NEGATIVE
NITRITE UA: NEGATIVE
SPEC GRAV UA: 1.023 (ref 1.005–1.030)
Urobilinogen, Ur: 0.2 mg/dL (ref 0.2–1.0)
pH, UA: 6 (ref 5.0–7.5)

## 2015-07-16 LAB — URINE CULTURE

## 2015-07-18 ENCOUNTER — Encounter: Payer: Self-pay | Admitting: Obstetrics and Gynecology

## 2015-07-19 ENCOUNTER — Telehealth: Payer: Self-pay | Admitting: Adult Health

## 2015-07-19 NOTE — Telephone Encounter (Signed)
Aware that has blood in urine but culture did not grow out, will recheck urine in 1 week

## 2015-07-21 ENCOUNTER — Telehealth: Payer: Self-pay | Admitting: Adult Health

## 2015-07-21 ENCOUNTER — Ambulatory Visit (INDEPENDENT_AMBULATORY_CARE_PROVIDER_SITE_OTHER): Payer: Medicare Other

## 2015-07-21 DIAGNOSIS — N83202 Unspecified ovarian cyst, left side: Secondary | ICD-10-CM | POA: Diagnosis not present

## 2015-07-21 DIAGNOSIS — N854 Malposition of uterus: Secondary | ICD-10-CM | POA: Diagnosis not present

## 2015-07-21 DIAGNOSIS — R102 Pelvic and perineal pain: Secondary | ICD-10-CM

## 2015-07-21 DIAGNOSIS — N9489 Other specified conditions associated with female genital organs and menstrual cycle: Secondary | ICD-10-CM

## 2015-07-21 NOTE — Telephone Encounter (Signed)
Daughter aware of [place] results  Will get CA 125 and schedule endometrial biopsy, if normal [place] in 3 months

## 2015-07-21 NOTE — Progress Notes (Addendum)
PELVIC US TA/TV: heterogenous anteverted uterus, thickened EEC 5.36mm in fundal area and 8.58mm in LUS,normal rt ov,lt ov cyst (??simple vs complex) 3.1 x 2.1 x 2.1cm,limited view of lt ov, lt adnexal pain during ultrasound,no free fluid seen.

## 2015-07-21 NOTE — Telephone Encounter (Signed)
Left message to call me about mom's [place]

## 2015-07-23 LAB — CA 125: CA 125: 17.3 U/mL (ref 0.0–38.1)

## 2015-07-25 ENCOUNTER — Telehealth: Payer: Self-pay | Admitting: Adult Health

## 2015-07-25 NOTE — Telephone Encounter (Signed)
Kendra Campos aware that CA 125 17.3 which is normal, will make appt for endo bx

## 2015-08-10 ENCOUNTER — Other Ambulatory Visit: Payer: Medicare Other | Admitting: Obstetrics and Gynecology

## 2015-08-18 ENCOUNTER — Ambulatory Visit (INDEPENDENT_AMBULATORY_CARE_PROVIDER_SITE_OTHER): Payer: Medicare Other | Admitting: Obstetrics and Gynecology

## 2015-08-18 VITALS — BP 140/90 | Wt 174.0 lb

## 2015-08-18 DIAGNOSIS — R938 Abnormal findings on diagnostic imaging of other specified body structures: Secondary | ICD-10-CM | POA: Diagnosis not present

## 2015-08-18 DIAGNOSIS — N390 Urinary tract infection, site not specified: Secondary | ICD-10-CM | POA: Diagnosis not present

## 2015-08-18 DIAGNOSIS — N898 Other specified noninflammatory disorders of vagina: Secondary | ICD-10-CM | POA: Diagnosis not present

## 2015-08-18 DIAGNOSIS — N83292 Other ovarian cyst, left side: Secondary | ICD-10-CM | POA: Diagnosis not present

## 2015-08-18 DIAGNOSIS — N882 Stricture and stenosis of cervix uteri: Secondary | ICD-10-CM | POA: Diagnosis not present

## 2015-08-18 NOTE — Progress Notes (Signed)
Patient ID: Kendra DellenFaye F Campos, female   DOB: 07/17/1923, 82 y.o.   MRN: [id number]  Pt here for endometrial biopsy after pelvic US on 07/22/15 showed irregular echo pattern in lower uterine segment which appeared slightly thickened at 8.5 mm; endometrium minimally thickened with sonographic interpretation of endometrial margins difficult due to apparent endometrial stripe and slight irregularity and 5.1 mm maximum thickening. She states she has brown and white discharge with urination.  Patient has specifically had NO vaginal bleeding , just a slight increase in vag d/c Endometrial stripe 3.8 mm benign appearance. Endometrial Biopsy  Patient given informed consent, signed copy in the chart, time out was performed. Appropriate time out taken. . The patient was placed in the lithotomy position and the cervix brought into view with sterile speculum.  Portio of cervix cleansed x 2 with betadine swabs.  A tenaculum was placed in the anterior lip of the cervix.  The uterus was unable to be sounded and sample unable to be collected due to cervical stenosis.  All equipment was removed and accounted for.  The patient tolerated the procedure well.     Patient given post procedure instructions.   Impression: Cervical stenosis . No vaginal lesions noted. Possibe endocervical polyp above cervical stenosis.  I spent 25 minutes with the visit with >than 50% spent in counseling and direct patient care.   By signing my name below, I, Doreatha MartinEva Mathews, attest that this documentation has been prepared under the direction and in the presence of Tilda BurrowJohn Prachi Oftedahl V, MD. Electronically Signed: Doreatha MartinEva Mathews, ED Scribe. 08/18/2015. 2:46 PM.  I personally performed the services described in this documentation, which was SCRIBED in my presence. The recorded information has been reviewed and considered accurate. It has been edited as necessary during review. Tilda BurrowFERGUSON,Len Azeez V, MD

## 2015-08-19 LAB — URINALYSIS, ROUTINE W REFLEX MICROSCOPIC
Bilirubin, UA: NEGATIVE
Glucose, UA: NEGATIVE
KETONES UA: NEGATIVE
NITRITE UA: POSITIVE — AB
PH UA: 6.5 (ref 5.0–7.5)
Protein, UA: NEGATIVE
Specific Gravity, UA: 1.008 (ref 1.005–1.030)
Urobilinogen, Ur: 0.2 mg/dL (ref 0.2–1.0)

## 2015-08-19 LAB — MICROSCOPIC EXAMINATION
Casts: NONE SEEN /lpf
WBC, UA: >30 /hpf — AB (ref 0–?)

## 2015-08-21 LAB — URINE CULTURE

## 2015-08-21 MED ORDER — CEPHALEXIN 500 MG PO CAPS: 500.0000 mg | ORAL_CAPSULE | Freq: Four times a day (QID) | ORAL | Status: DC

## 2015-08-21 NOTE — Addendum Note (Signed)
Addended by: Tilda BurrowFERGUSON, Jynesis Nakamura V on: 08/21/2015 01:07 PM   Modules accepted: Orders

## 2015-08-22 ENCOUNTER — Telehealth: Payer: Self-pay | Admitting: *Deleted

## 2015-08-22 NOTE — Telephone Encounter (Signed)
Informed Kendra Campos (on HIPAA consent form), pt Daughter, + urine culture, prescription e-scribed.

## 2015-08-22 NOTE — Telephone Encounter (Signed)
-----   Message from Tilda BurrowJohn Ferguson V, MD sent at 08/21/2015  1:04 PM EDT ----- Positive urine culture , has foley in place. Will tx septra DS bid x 7 d. Please inform pt.

## 2015-08-31 NOTE — Telephone Encounter (Signed)
Dr. Emelda FearFerguson gave a verbal order for Septra DS BID x 7 days, [facility]. Charlott RakesBetty Ross (is on HIPAA form), Pt daughter informed. Also informed if symptoms do not improve to call our office back.

## 2015-09-19 ENCOUNTER — Encounter (HOSPITAL_BASED_OUTPATIENT_CLINIC_OR_DEPARTMENT_OTHER): Payer: Self-pay

## 2015-10-11 ENCOUNTER — Other Ambulatory Visit (HOSPITAL_COMMUNITY)
Admission: AD | Admit: 2015-10-11 | Discharge: 2015-10-11 | Disposition: A | Discharge status: Home / Self Care | Payer: Medicare Other | Source: Skilled Nursing Facility | Attending: Family Medicine | Admitting: Family Medicine

## 2015-10-11 DIAGNOSIS — N390 Urinary tract infection, site not specified: Secondary | ICD-10-CM | POA: Diagnosis present

## 2015-10-11 LAB — URINALYSIS, ROUTINE W REFLEX MICROSCOPIC
Bilirubin Urine: NEGATIVE
GLUCOSE, UA: NEGATIVE mg/dL
KETONES UR: NEGATIVE mg/dL
Nitrite: NEGATIVE
Specific Gravity, Urine: 1.01 (ref 1.005–1.030)
pH: 6.5 (ref 5.0–8.0)

## 2015-10-11 LAB — URINE MICROSCOPIC-ADD ON

## 2015-10-14 LAB — URINE CULTURE

## 2015-11-21 ENCOUNTER — Other Ambulatory Visit (HOSPITAL_COMMUNITY)
Admission: RE | Admit: 2015-11-21 | Discharge: 2015-11-21 | Disposition: A | Discharge status: Home / Self Care | Payer: Medicare Other | Source: Other Acute Inpatient Hospital | Attending: Family Medicine | Admitting: Family Medicine

## 2015-11-21 DIAGNOSIS — N390 Urinary tract infection, site not specified: Secondary | ICD-10-CM | POA: Insufficient documentation

## 2015-11-21 LAB — URINALYSIS, ROUTINE W REFLEX MICROSCOPIC
BILIRUBIN URINE: NEGATIVE
GLUCOSE, UA: NEGATIVE mg/dL
KETONES UR: NEGATIVE mg/dL
Nitrite: NEGATIVE
PH: 5.5 (ref 5.0–8.0)
PROTEIN: 100 mg/dL — AB
Specific Gravity, Urine: 1.015 (ref 1.005–1.030)

## 2015-11-21 LAB — URINE MICROSCOPIC-ADD ON: Squamous Epithelial / LPF: NONE SEEN

## 2015-11-24 LAB — URINE CULTURE: Culture: >=100000 — AB

## 2016-01-03 ENCOUNTER — Encounter (HOSPITAL_COMMUNITY): Payer: Self-pay | Admitting: Emergency Medicine

## 2016-01-03 ENCOUNTER — Emergency Department (HOSPITAL_COMMUNITY): Payer: Medicare Other

## 2016-01-03 ENCOUNTER — Emergency Department (HOSPITAL_COMMUNITY)
Admission: EM | Admit: 2016-01-03 | Discharge: 2016-01-03 | Disposition: A | Discharge status: Home / Self Care | Payer: Medicare Other | Attending: Emergency Medicine | Admitting: Emergency Medicine

## 2016-01-03 DIAGNOSIS — Z79899 Other long term (current) drug therapy: Secondary | ICD-10-CM | POA: Insufficient documentation

## 2016-01-03 DIAGNOSIS — E119 Type 2 diabetes mellitus without complications: Secondary | ICD-10-CM | POA: Insufficient documentation

## 2016-01-03 DIAGNOSIS — I10 Essential (primary) hypertension: Secondary | ICD-10-CM | POA: Diagnosis not present

## 2016-01-03 DIAGNOSIS — N390 Urinary tract infection, site not specified: Secondary | ICD-10-CM | POA: Insufficient documentation

## 2016-01-03 DIAGNOSIS — Z79891 Long term (current) use of opiate analgesic: Secondary | ICD-10-CM | POA: Diagnosis not present

## 2016-01-03 DIAGNOSIS — R0602 Shortness of breath: Secondary | ICD-10-CM | POA: Diagnosis present

## 2016-01-03 LAB — CBC WITH DIFFERENTIAL/PLATELET
BASOS ABS: 0 10*3/uL (ref 0.0–0.1)
BASOS PCT: 0 %
Eosinophils Absolute: 0.2 10*3/uL (ref 0.0–0.7)
Eosinophils Relative: 2 %
HEMATOCRIT: 30.6 % — AB (ref 36.0–46.0)
HEMOGLOBIN: 10.4 g/dL — AB (ref 12.0–15.0)
LYMPHS PCT: 17 %
Lymphs Abs: 1.7 10*3/uL (ref 0.7–4.0)
MCH: 29.4 pg (ref 26.0–34.0)
MCHC: 34 g/dL (ref 30.0–36.0)
MCV: 86.4 fL (ref 78.0–100.0)
MONO ABS: 1.1 10*3/uL — AB (ref 0.1–1.0)
Monocytes Relative: 11 %
NEUTROS ABS: 7 10*3/uL (ref 1.7–7.7)
NEUTROS PCT: 70 %
Platelets: 268 10*3/uL (ref 150–400)
RBC: 3.54 MIL/uL — ABNORMAL LOW (ref 3.87–5.11)
RDW: 13.5 % (ref 11.5–15.5)
WBC: 9.9 10*3/uL (ref 4.0–10.5)

## 2016-01-03 LAB — BASIC METABOLIC PANEL
ANION GAP: 6 (ref 5–15)
BUN: 26 mg/dL — AB (ref 6–20)
CHLORIDE: 102 mmol/L (ref 101–111)
CO2: 26 mmol/L (ref 22–32)
Calcium: 8.6 mg/dL — ABNORMAL LOW (ref 8.9–10.3)
Creatinine, Ser: 0.92 mg/dL (ref 0.44–1.00)
GFR calc Af Amer: >60 mL/min (ref >60)
GFR calc non Af Amer: 54 mL/min — ABNORMAL LOW (ref >60)
GLUCOSE: 102 mg/dL — AB (ref 65–99)
POTASSIUM: 4.4 mmol/L (ref 3.5–5.1)
Sodium: 134 mmol/L — ABNORMAL LOW (ref 135–145)

## 2016-01-03 LAB — URINALYSIS, ROUTINE W REFLEX MICROSCOPIC
Bilirubin Urine: NEGATIVE
GLUCOSE, UA: NEGATIVE mg/dL
KETONES UR: NEGATIVE mg/dL
NITRITE: NEGATIVE
PH: 6 (ref 5.0–8.0)
Protein, ur: 100 mg/dL — AB
SPECIFIC GRAVITY, URINE: 1.01 (ref 1.005–1.030)

## 2016-01-03 LAB — URINE MICROSCOPIC-ADD ON: SQUAMOUS EPITHELIAL / LPF: NONE SEEN

## 2016-01-03 LAB — TROPONIN I

## 2016-01-03 LAB — BRAIN NATRIURETIC PEPTIDE: B Natriuretic Peptide: 346 pg/mL — ABNORMAL HIGH (ref 0.0–100.0)

## 2016-01-03 MED ORDER — CIPROFLOXACIN HCL 500 MG PO TABS: 500.0000 mg | ORAL_TABLET | Freq: Two times a day (BID) | ORAL | 0 refills | Status: DC

## 2016-01-03 MED ORDER — CIPROFLOXACIN HCL 250 MG PO TABS
500.0000 mg | ORAL_TABLET | Freq: Once | ORAL | Status: AC
  Administered 2016-01-03: 500 mg via ORAL
  Filled 2016-01-03: qty 2

## 2016-01-03 NOTE — ED Notes (Signed)
Pt repeatedly calling out stating, "please help me, I need help." When this nurse goes into pt's room, she either says, "I want to go home" or "I want to sit up in bed." Pt will not use call bell even though it is laying in her lap.

## 2016-01-03 NOTE — ED Notes (Signed)
Pt denies chest pain to this RN. Pt c/o bilateral knee pain, back pain, and right ankle pain.

## 2016-01-03 NOTE — ED Triage Notes (Signed)
Pt c/o sob and back pain. Pt states she hurts all over. Ems was called out for chest pain.

## 2016-01-03 NOTE — ED Provider Notes (Signed)
[facility]-EM[facility]ENCY DEPT Provider Note   CSN: [id number] Arrival date & time: 01/03/16  [id number]  First Provider Contact:  First MD Initiated Contact with Patient 01/03/16 [phone]        History   Chief Complaint Chief Complaint  Patient presents with  . Shortness of Breath   Level V caveat for age  HPI Kendra Campos is a 82 y.o. female.  HPI patient was brought to the emergency department from her nursing facility by EMS. When I talked to the patient she states she had a dream. And she doesn't remember why she is coming here. She then states she was calling for help to go to the bathroom. She states they came and helped her go to the bathroom. She states she has chronic back pain which is stable. She denies chest pain or abdominal pain. She states sometimes she can't swallow. She states sometimes she has to breathe deep to breathe well. She denies any cough. She then states that she did not want to come to the ED, she doesn't want anything done. She then states to call her doctor and her daughter to see what she should have done. Patient recently finished a course of Bactrim but was started on July 24 for 5 days. Patient is also on hydrocodone for her chronic back pain. She normally gets it 4 times a day however she only got it twice today.    PCP Dr Phillips Odor [facility] Physician Dr Laurena Bering  Past Medical History:  Diagnosis Date  . Arthritis   . Back pain   . Diabetes mellitus   . Hypercholesteremia   . Hypertension   . Pelvic pain in female 07/15/2015  . Pelvic pressure in female 07/15/2015  . Thyroid disease   . UTI (lower urinary tract infection)     Patient Active Problem List   Diagnosis Date Noted  . Pelvic pressure in female 07/15/2015  . Pelvic pain in female 07/15/2015  . Essential hypertension 08/07/2013  . Hyperlipidemia 08/07/2013  . GERD (gastroesophageal reflux disease) 06/09/2012    Past Surgical History:  Procedure Laterality Date  . CHOLECYSTECTOMY    .  gall stones      OB History    Gravida Para Term Preterm AB Living   1 1       1    SAB TAB Ectopic Multiple Live Births                   Home Medications    Prior to Admission medications   Medication Sig Start Date End Date Taking? Authorizing Provider  busPIRone (BUSPAR) 7.5 MG tablet Take 7.5 mg by mouth 3 (three) times daily.   Yes Historical Provider, MD  Cranberry Juice Powder 425 MG CAPS Take by mouth.   Yes Historical Provider, MD  HYDROcodone-acetaminophen (NORCO/VICODIN) 5-325 MG tablet Take 1 tablet by mouth 4 (four) times daily.    Yes Historical Provider, MD  levothyroxine (SYNTHROID, LEVOTHROID) 50 MCG tablet Take 50 mcg by mouth daily before breakfast.   Yes Historical Provider, MD  loratadine (CLARITIN) 10 MG tablet Take 10 mg by mouth daily.   Yes Historical Provider, MD  MELATONIN PO Take 5 mg by mouth at bedtime.    Yes Historical Provider, MD  polyethylene glycol (MIRALAX / GLYCOLAX) packet Take 17 g by mouth daily.   Yes Historical Provider, MD  ranitidine (ZANTAC) 150 MG tablet Take 150 mg by mouth 2 (two) times daily.   Yes Historical Provider, MD  sertraline (ZOLOFT) 25 MG tablet Take 25 mg by mouth daily.   Yes Historical Provider, MD  cephALEXin (KEFLEX) 500 MG capsule Take 1 capsule (500 mg total) by mouth 4 (four) times daily. 08/21/15   Tilda Burrow, MD  ciprofloxacin (CIPRO) 500 MG tablet Take 1 tablet (500 mg total) by mouth 2 (two) times daily. 01/03/16   Devoria Albe, MD  Multiple Vitamin (MULITIVITAMIN WITH MINERALS) TABS Take 1 tablet by mouth daily.      Historical Provider, MD  traMADol (ULTRAM) 50 MG tablet Take 50 mg by mouth every 6 (six) hours as needed.    Historical Provider, MD  valsartan (DIOVAN) 40 MG tablet Take 1 tablet (40 mg total) by mouth daily. <PLEASE MAKE APPOINTMENT FOR REFILLS> Patient taking differently: Take 20 mg by mouth daily. <PLEASE MAKE APPOINTMENT FOR REFILLS> 10/06/14   Runell Gess, MD    Family History History  reviewed. No pertinent family history.  Social History Social History  Substance Use Topics  . Smoking status: Never Smoker  . Smokeless tobacco: Never Used  . Alcohol use No  Lives in a nursing home   Allergies   Review of patient's allergies indicates no known allergies.   Review of Systems Review of Systems  Unable to perform ROS: Age     Physical Exam Updated Vital Signs BP 154/60 (BP Location: Left Arm)   Pulse 62   Temp 97.5 F (36.4 C) (Oral)   Resp 20   Ht  (1.6 m)   SpO2 97%   Vital signs normal    Physical Exam  Constitutional: She appears well-developed and well-nourished.  Non-toxic appearance. She does not appear ill. No distress.  Speaks loudly, wearing hearing aids  HENT:  Head: Normocephalic and atraumatic.  Right Ear: External ear normal.  Left Ear: External ear normal.  Nose: Nose normal. No mucosal edema or rhinorrhea.  Mouth/Throat: Oropharynx is clear and moist and mucous membranes are normal. No dental abscesses or uvula swelling.  Eyes: Conjunctivae and EOM are normal. Pupils are equal, round, and reactive to light.  Neck: Normal range of motion and full passive range of motion without pain. Neck supple.  Cardiovascular: Normal rate, regular rhythm and normal heart sounds.  Exam reveals no gallop and no friction rub.   No murmur heard. Pulmonary/Chest: Effort normal and breath sounds normal. No respiratory distress. She has no wheezes. She has no rhonchi. She has no rales. She exhibits no tenderness and no crepitus.  Abdominal: Soft. Normal appearance and bowel sounds are normal. She exhibits no distension. There is no tenderness. There is no rebound and no guarding.  Patient has a lower incision that is well-healed in her right abdomen, however when she strains there is a bulging consistent with a hernia. The area is soft.  Musculoskeletal:  Patient has enlargement of both knees without joint effusion. Her right foot deviates laterally  from a prior injury.  Neurological: She is alert. She has normal strength. No cranial nerve deficit.  Skin: Skin is warm, dry and intact. No rash noted. No erythema. No pallor.  Psychiatric: She has a normal mood and affect. Her speech is normal and behavior is normal. Her mood appears not anxious.  Nursing note and vitals reviewed.    ED Treatments / Results  Labs (all labs ordered are listed, but only abnormal results are displayed)    Results for orders placed or performed during the hospital encounter of 01/03/16  Basic metabolic panel  Result  Value Ref Range   Sodium 134 (L) 135 - 145 mmol/L   Potassium 4.4 3.5 - 5.1 mmol/L   Chloride 102 101 - 111 mmol/L   CO2 26 22 - 32 mmol/L   Glucose, Bld 102 (H) 65 - 99 mg/dL   BUN 26 (H) 6 - 20 mg/dL   Creatinine, Ser 8.33 0.44 - 1.00 mg/dL   Calcium 8.6 (L) 8.9 - 10.3 mg/dL   GFR calc non Af Amer 54 (L) >60 mL/min   GFR calc Af Amer >60 >60 mL/min   Anion gap 6 5 - 15  Troponin I  Result Value Ref Range   Troponin I <0.03 <0.03 ng/mL  CBC with Differential  Result Value Ref Range   WBC 9.9 4.0 - 10.5 K/uL   RBC 3.54 (L) 3.87 - 5.11 MIL/uL   Hemoglobin 10.4 (L) 12.0 - 15.0 g/dL   HCT 38.3 (L) 29.1 - 91.6 %   MCV 86.4 78.0 - 100.0 fL   MCH 29.4 26.0 - 34.0 pg   MCHC 34.0 30.0 - 36.0 g/dL   RDW 60.6 00.4 - 59.9 %   Platelets 268 150 - 400 K/uL   Neutrophils Relative % 70 %   Neutro Abs 7.0 1.7 - 7.7 K/uL   Lymphocytes Relative 17 %   Lymphs Abs 1.7 0.7 - 4.0 K/uL   Monocytes Relative 11 %   Monocytes Absolute 1.1 (H) 0.1 - 1.0 K/uL   Eosinophils Relative 2 %   Eosinophils Absolute 0.2 0.0 - 0.7 K/uL   Basophils Relative 0 %   Basophils Absolute 0.0 0.0 - 0.1 K/uL  Urinalysis, Routine w reflex microscopic (not at [facility])  Result Value Ref Range   Color, Urine YELLOW YELLOW   APPearance HAZY (A) CLEAR   Specific Gravity, Urine 1.010 1.005 - 1.030   pH 6.0 5.0 - 8.0   Glucose, UA NEGATIVE NEGATIVE mg/dL   Hgb urine  dipstick MODERATE (A) NEGATIVE   Bilirubin Urine NEGATIVE NEGATIVE   Ketones, ur NEGATIVE NEGATIVE mg/dL   Protein, ur 774 (A) NEGATIVE mg/dL   Nitrite NEGATIVE NEGATIVE   Leukocytes, UA MODERATE (A) NEGATIVE  Brain natriuretic peptide  Result Value Ref Range   B Natriuretic Peptide 346.0 (H) 0.0 - 100.0 pg/mL  Urine microscopic-add on  Result Value Ref Range   Squamous Epithelial / LPF NONE SEEN NONE SEEN   WBC, UA TOO NUMEROUS TO COUNT 0 - 5 WBC/hpf   RBC / HPF TOO NUMEROUS TO COUNT 0 - 5 RBC/hpf   Bacteria, UA MANY (A) NONE SEEN   Laboratory interpretation all normal except UTI, mild anemia     Results for orders placed or performed during the hospital encounter of 11/21/15  Culture, Urine  Result Value Ref Range   Specimen Description URINE, CLEAN CATCH    Special Requests NONE    Culture (A)     >=100,000 COLONIES/mL KLEBSIELLA PNEUMONIAE Confirmed Extended Spectrum Beta-Lactamase Producer (ESBL) Performed at [facility]    Report Status 11/24/2015 FINAL    Organism ID, Bacteria KLEBSIELLA PNEUMONIAE (A)       Susceptibility   Klebsiella pneumoniae - MIC*    AMPICILLIN >=32 RESISTANT Resistant     CEFAZOLIN >=64 RESISTANT Resistant     CEFTRIAXONE 32 RESISTANT Resistant     CIPROFLOXACIN 1 SENSITIVE Sensitive     GENTAMICIN >=16 RESISTANT Resistant     IMIPENEM <=0.25 SENSITIVE Sensitive     NITROFURANTOIN 64 INTERMEDIATE Intermediate     TRIMETH/SULFA >=  320 RESISTANT Resistant     AMPICILLIN/SULBACTAM >=32 RESISTANT Resistant     PIP/TAZO <=4 SENSITIVE Sensitive     * >=100,000 COLONIES/mL KLEBSIELLA PNEUMONIAE  Urinalysis, Routine w reflex microscopic (not at [facility])  Result Value Ref Range   Color, Urine YELLOW YELLOW   APPearance HAZY (A) CLEAR   Specific Gravity, Urine 1.015 1.005 - 1.030   pH 5.5 5.0 - 8.0   Glucose, UA NEGATIVE NEGATIVE mg/dL   Hgb urine dipstick MODERATE (A) NEGATIVE   Bilirubin Urine NEGATIVE NEGATIVE   Ketones, ur NEGATIVE  NEGATIVE mg/dL   Protein, ur 811 (A) NEGATIVE mg/dL   Nitrite NEGATIVE NEGATIVE   Leukocytes, UA MODERATE (A) NEGATIVE  Urine microscopic-add on  Result Value Ref Range   Squamous Epithelial / LPF NONE SEEN NONE SEEN   WBC, UA TOO NUMEROUS TO COUNT 0 - 5 WBC/hpf   RBC / HPF TOO NUMEROUS TO COUNT 0 - 5 RBC/hpf   Bacteria, UA MANY (A) NONE SEEN      EKG  EKG Interpretation  Date/Time:  Tuesday January 03 2016 03:38:36 EDT Ventricular Rate:  68 PR Interval:    QRS Duration: 129 QT Interval:  463 QTC Calculation: 493 R Axis:   -54 Text Interpretation:  Sinus rhythm Short PR interval RBBB and LAFB Baseline wander Since last tracing rate slower 27 Jul 2005 Confirmed by Garin Mata  MD-I, [place] ([id number]) on 01/03/2016 4:03:01 AM       Radiology Dg Chest Port 1 View  Result Date: 01/03/2016 CLINICAL DATA:  Shortness of breath, back pain, bilateral lower extremity pain tonight. No known injury. EXAM: PORTABLE CHEST 1 VIEW COMPARISON:  None. FINDINGS: Mild cardiomegaly. Tortuous thoracic aorta with atherosclerosis. Cephalization of pulmonary vasculature with peribronchial cuffing suggesting pulmonary edema. Small right pleural effusion. No pneumothorax or focal airspace opacity. Surgical clips project over the left hemithorax. Advanced degenerative change of the right shoulder. IMPRESSION: 1. CHF pattern with cardiomegaly, pulmonary edema and right pleural effusion. 2. Thoracic aortic atherosclerosis. Electronically Signed   By: Rubye Oaks M.D.   On: 01/03/2016 03:44    Procedures Procedures (including critical care time)  Medications Ordered in ED Medications  ciprofloxacin (CIPRO) tablet 500 mg (not administered)     Initial Impression / Assessment and Plan / ED Course  I have reviewed the triage vital signs and the nursing notes.  Pertinent labs & imaging results that were available during my care of the patient were reviewed by me and considered in my medical decision making (see  chart for details).  Clinical Course   When I review patient's chest x-ray she has blunting of the right costophrenic angle. And with her normal BNP in her normal pulse ox I am not concerned that she is having congestive heart failure.  However her urinalysis shows that she still has a very bad urinate tract infection. She did have a urine culture in June that showed 100,000 colonies of Klebsiella pneumonia that was resistant to everything except Cipro. Although I know Cipro generally is avoided in the elderly there does not seem to be any other choice at this point. She was started on Cipro. She had just finished a seven-day course of Bactrim in the past 2 days. Urine culture was sent.   Final Clinical Impressions(s) / ED Diagnoses   Final diagnoses:  UTI (lower urinary tract infection)    New Prescriptions New Prescriptions   CIPROFLOXACIN (CIPRO) 500 MG TABLET    Take 1 tablet (500 mg total) by  mouth 2 (two) times daily.    Plan discharge  Devoria Albe, MD, Concha Pyo, MD 01/03/16 [phone]

## 2016-01-03 NOTE — Discharge Instructions (Signed)
Her tests tonight show that she still has a urinary tract infection.Based on a urine culture done in June she was placed on Cipro. Give it to her twice a day until gone. Have her rechecked if she gets a fever, vomiting, or isn't improving in the next 24-48 hours. She had another urine culture done tonight, you can get those results in about 2 days.

## 2016-01-05 LAB — URINE CULTURE: Culture: >=100000 — AB

## 2016-01-06 ENCOUNTER — Telehealth (HOSPITAL_BASED_OUTPATIENT_CLINIC_OR_DEPARTMENT_OTHER): Payer: Self-pay | Admitting: *Deleted

## 2016-01-06 NOTE — Telephone Encounter (Signed)
Post ED Visit - Positive Culture Follow-up  Culture report reviewed by antimicrobial stewardship pharmacist:  []  Enzo Bi, Pharm.D. [x]  Celedonio Miyamoto, Pharm.D., BCPS []  Garvin Fila, Pharm.D. []  Georgina Pillion, Pharm.D., BCPS []  [place], [address].D., BCPS, AAHIVP []  Estella Husk, Pharm.D., BCPS, AAHIVP []  Tennis Must, Pharm.D. []  Rob Oswaldo Done, [address].D.  Positive Klebsiella Pneumoniae urine culture Treated with ciprofloxacin hcl, organism sensitive to the same and no further patient follow-up is required at this time.  Wrigley Plasencia, Dixon Boos 01/06/2016, 2:35 PM

## 2016-01-12 ENCOUNTER — Other Ambulatory Visit (HOSPITAL_COMMUNITY)
Admission: AD | Admit: 2016-01-12 | Discharge: 2016-01-12 | Disposition: A | Discharge status: Home / Self Care | Payer: Medicare Other | Source: Skilled Nursing Facility | Attending: Family Medicine | Admitting: Family Medicine

## 2016-01-12 DIAGNOSIS — N390 Urinary tract infection, site not specified: Secondary | ICD-10-CM | POA: Diagnosis present

## 2016-01-12 LAB — URINALYSIS, ROUTINE W REFLEX MICROSCOPIC
Bilirubin Urine: NEGATIVE
Glucose, UA: NEGATIVE mg/dL
Ketones, ur: NEGATIVE mg/dL
NITRITE: NEGATIVE
Protein, ur: 100 mg/dL — AB
SPECIFIC GRAVITY, URINE: 1.025 (ref 1.005–1.030)
pH: 6 (ref 5.0–8.0)

## 2016-01-12 LAB — URINE MICROSCOPIC-ADD ON

## 2016-01-14 LAB — URINE CULTURE

## 2016-02-14 ENCOUNTER — Inpatient Hospital Stay (HOSPITAL_COMMUNITY)
Admission: EM | Admit: 2016-02-14 | Discharge: 2016-02-24 | DRG: 091 | Disposition: A | Discharge status: Hospice | Payer: Medicare Other | Attending: Internal Medicine | Admitting: Internal Medicine

## 2016-02-14 ENCOUNTER — Encounter (HOSPITAL_COMMUNITY): Payer: Self-pay | Admitting: Emergency Medicine

## 2016-02-14 ENCOUNTER — Emergency Department (HOSPITAL_COMMUNITY): Payer: Medicare Other

## 2016-02-14 DIAGNOSIS — R4182 Altered mental status, unspecified: Secondary | ICD-10-CM | POA: Diagnosis present

## 2016-02-14 DIAGNOSIS — I110 Hypertensive heart disease with heart failure: Secondary | ICD-10-CM | POA: Diagnosis present

## 2016-02-14 DIAGNOSIS — I5023 Acute on chronic systolic (congestive) heart failure: Secondary | ICD-10-CM | POA: Diagnosis present

## 2016-02-14 DIAGNOSIS — K439 Ventral hernia without obstruction or gangrene: Secondary | ICD-10-CM | POA: Diagnosis present

## 2016-02-14 DIAGNOSIS — Z792 Long term (current) use of antibiotics: Secondary | ICD-10-CM

## 2016-02-14 DIAGNOSIS — K219 Gastro-esophageal reflux disease without esophagitis: Secondary | ICD-10-CM | POA: Diagnosis present

## 2016-02-14 DIAGNOSIS — R627 Adult failure to thrive: Secondary | ICD-10-CM | POA: Diagnosis not present

## 2016-02-14 DIAGNOSIS — Z66 Do not resuscitate: Secondary | ICD-10-CM | POA: Diagnosis present

## 2016-02-14 DIAGNOSIS — R5381 Other malaise: Secondary | ICD-10-CM

## 2016-02-14 DIAGNOSIS — Z993 Dependence on wheelchair: Secondary | ICD-10-CM | POA: Diagnosis not present

## 2016-02-14 DIAGNOSIS — Z87440 Personal history of urinary (tract) infections: Secondary | ICD-10-CM

## 2016-02-14 DIAGNOSIS — R0600 Dyspnea, unspecified: Secondary | ICD-10-CM | POA: Diagnosis not present

## 2016-02-14 DIAGNOSIS — T43595A Adverse effect of other antipsychotics and neuroleptics, initial encounter: Secondary | ICD-10-CM | POA: Diagnosis present

## 2016-02-14 DIAGNOSIS — Z23 Encounter for immunization: Secondary | ICD-10-CM

## 2016-02-14 DIAGNOSIS — E119 Type 2 diabetes mellitus without complications: Secondary | ICD-10-CM | POA: Diagnosis present

## 2016-02-14 DIAGNOSIS — J690 Pneumonitis due to inhalation of food and vomit: Secondary | ICD-10-CM | POA: Diagnosis not present

## 2016-02-14 DIAGNOSIS — E039 Hypothyroidism, unspecified: Secondary | ICD-10-CM | POA: Diagnosis present

## 2016-02-14 DIAGNOSIS — N390 Urinary tract infection, site not specified: Secondary | ICD-10-CM | POA: Diagnosis present

## 2016-02-14 DIAGNOSIS — G92 Toxic encephalopathy: Secondary | ICD-10-CM | POA: Diagnosis present

## 2016-02-14 DIAGNOSIS — Z515 Encounter for palliative care: Secondary | ICD-10-CM | POA: Diagnosis not present

## 2016-02-14 DIAGNOSIS — R404 Transient alteration of awareness: Secondary | ICD-10-CM | POA: Diagnosis not present

## 2016-02-14 DIAGNOSIS — E876 Hypokalemia: Secondary | ICD-10-CM | POA: Diagnosis present

## 2016-02-14 DIAGNOSIS — T43215A Adverse effect of selective serotonin and norepinephrine reuptake inhibitors, initial encounter: Secondary | ICD-10-CM | POA: Diagnosis present

## 2016-02-14 DIAGNOSIS — T43225A Adverse effect of selective serotonin reuptake inhibitors, initial encounter: Secondary | ICD-10-CM | POA: Diagnosis present

## 2016-02-14 DIAGNOSIS — K631 Perforation of intestine (nontraumatic): Secondary | ICD-10-CM | POA: Diagnosis present

## 2016-02-14 DIAGNOSIS — Z7189 Other specified counseling: Secondary | ICD-10-CM | POA: Diagnosis not present

## 2016-02-14 DIAGNOSIS — T391X5A Adverse effect of 4-Aminophenol derivatives, initial encounter: Secondary | ICD-10-CM | POA: Diagnosis present

## 2016-02-14 DIAGNOSIS — I10 Essential (primary) hypertension: Secondary | ICD-10-CM | POA: Diagnosis not present

## 2016-02-14 DIAGNOSIS — R4020 Unspecified coma: Secondary | ICD-10-CM | POA: Diagnosis not present

## 2016-02-14 DIAGNOSIS — R509 Fever, unspecified: Secondary | ICD-10-CM

## 2016-02-14 DIAGNOSIS — N3000 Acute cystitis without hematuria: Secondary | ICD-10-CM | POA: Diagnosis not present

## 2016-02-14 DIAGNOSIS — T424X5A Adverse effect of benzodiazepines, initial encounter: Secondary | ICD-10-CM | POA: Diagnosis present

## 2016-02-14 DIAGNOSIS — M6281 Muscle weakness (generalized): Secondary | ICD-10-CM

## 2016-02-14 DIAGNOSIS — Z79899 Other long term (current) drug therapy: Secondary | ICD-10-CM | POA: Diagnosis not present

## 2016-02-14 DIAGNOSIS — E7800 Pure hypercholesterolemia, unspecified: Secondary | ICD-10-CM | POA: Diagnosis present

## 2016-02-14 DIAGNOSIS — A499 Bacterial infection, unspecified: Secondary | ICD-10-CM | POA: Diagnosis not present

## 2016-02-14 DIAGNOSIS — J189 Pneumonia, unspecified organism: Secondary | ICD-10-CM | POA: Diagnosis not present

## 2016-02-14 DIAGNOSIS — Z9049 Acquired absence of other specified parts of digestive tract: Secondary | ICD-10-CM

## 2016-02-14 DIAGNOSIS — R600 Localized edema: Secondary | ICD-10-CM

## 2016-02-14 DIAGNOSIS — R410 Disorientation, unspecified: Secondary | ICD-10-CM | POA: Diagnosis not present

## 2016-02-14 DIAGNOSIS — Z8619 Personal history of other infectious and parasitic diseases: Secondary | ICD-10-CM | POA: Diagnosis not present

## 2016-02-14 DIAGNOSIS — Z1612 Extended spectrum beta lactamase (ESBL) resistance: Secondary | ICD-10-CM | POA: Diagnosis present

## 2016-02-14 DIAGNOSIS — R109 Unspecified abdominal pain: Secondary | ICD-10-CM

## 2016-02-14 LAB — CBC WITH DIFFERENTIAL/PLATELET
Basophils Absolute: 0 10*3/uL (ref 0.0–0.1)
Basophils Relative: 0 %
EOS PCT: 1 %
Eosinophils Absolute: 0.1 10*3/uL (ref 0.0–0.7)
HCT: 37.9 % (ref 36.0–46.0)
Hemoglobin: 12.5 g/dL (ref 12.0–15.0)
LYMPHS ABS: 1.5 10*3/uL (ref 0.7–4.0)
LYMPHS PCT: 13 %
MCH: 27.8 pg (ref 26.0–34.0)
MCHC: 33 g/dL (ref 30.0–36.0)
MCV: 84.4 fL (ref 78.0–100.0)
MONO ABS: 1.5 10*3/uL — AB (ref 0.1–1.0)
MONOS PCT: 12 %
Neutro Abs: 9 10*3/uL — ABNORMAL HIGH (ref 1.7–7.7)
Neutrophils Relative %: 74 %
PLATELETS: 311 10*3/uL (ref 150–400)
RBC: 4.49 MIL/uL (ref 3.87–5.11)
RDW: 14.2 % (ref 11.5–15.5)
WBC: 12.1 10*3/uL — ABNORMAL HIGH (ref 4.0–10.5)

## 2016-02-14 LAB — BASIC METABOLIC PANEL
Anion gap: 11 (ref 5–15)
BUN: 14 mg/dL (ref 6–20)
CHLORIDE: 90 mmol/L — AB (ref 101–111)
CO2: 29 mmol/L (ref 22–32)
Calcium: 9.4 mg/dL (ref 8.9–10.3)
Creatinine, Ser: 0.88 mg/dL (ref 0.44–1.00)
GFR calc Af Amer: >60 mL/min (ref >60)
GFR calc non Af Amer: 56 mL/min — ABNORMAL LOW (ref >60)
GLUCOSE: 88 mg/dL (ref 65–99)
POTASSIUM: 3.5 mmol/L (ref 3.5–5.1)
Sodium: 130 mmol/L — ABNORMAL LOW (ref 135–145)

## 2016-02-14 LAB — URINALYSIS, ROUTINE W REFLEX MICROSCOPIC
BILIRUBIN URINE: NEGATIVE
GLUCOSE, UA: NEGATIVE mg/dL
Ketones, ur: NEGATIVE mg/dL
Nitrite: NEGATIVE
PH: 7 (ref 5.0–8.0)
Protein, ur: 100 mg/dL — AB
SPECIFIC GRAVITY, URINE: 1.02 (ref 1.005–1.030)

## 2016-02-14 LAB — URINE MICROSCOPIC-ADD ON

## 2016-02-14 LAB — MRSA PCR SCREENING: MRSA by PCR: NEGATIVE

## 2016-02-14 LAB — I-STAT CG4 LACTIC ACID, ED: Lactic Acid, Venous: 0.91 mmol/L (ref 0.5–1.9)

## 2016-02-14 MED ORDER — BUSPIRONE HCL 5 MG PO TABS
7.5000 mg | ORAL_TABLET | Freq: Three times a day (TID) | ORAL | Status: DC
  Administered 2016-02-14 – 2016-02-22 (×18): 7.5 mg via ORAL
  Filled 2016-02-14 (×20): qty 2

## 2016-02-14 MED ORDER — SODIUM CHLORIDE 0.9 % IV SOLN
INTRAVENOUS | Status: AC
  Administered 2016-02-14: 17:00:00 via INTRAVENOUS

## 2016-02-14 MED ORDER — INFLUENZA VAC SPLIT QUAD 0.5 ML IM SUSY
0.5000 mL | PREFILLED_SYRINGE | INTRAMUSCULAR | Status: AC
  Administered 2016-02-16: 0.5 mL via INTRAMUSCULAR
  Filled 2016-02-14: qty 0.5

## 2016-02-14 MED ORDER — DEXTROSE 5 % IV SOLN
1.0000 g | Freq: Once | INTRAVENOUS | Status: AC
  Administered 2016-02-14: 1 g via INTRAVENOUS
  Filled 2016-02-14: qty 10

## 2016-02-14 MED ORDER — LORAZEPAM 0.5 MG PO TABS
0.5000 mg | ORAL_TABLET | Freq: Three times a day (TID) | ORAL | Status: DC
  Administered 2016-02-14: 0.5 mg via ORAL
  Filled 2016-02-14: qty 1

## 2016-02-14 MED ORDER — TRAZODONE HCL 50 MG PO TABS
50.0000 mg | ORAL_TABLET | Freq: Every day | ORAL | Status: DC
  Administered 2016-02-15 – 2016-02-16 (×2): 50 mg via ORAL
  Filled 2016-02-14 (×4): qty 1

## 2016-02-14 MED ORDER — SODIUM CHLORIDE 0.9 % IV BOLUS (SEPSIS)
500.0000 mL | Freq: Once | INTRAVENOUS | Status: AC
  Administered 2016-02-14: 500 mL via INTRAVENOUS

## 2016-02-14 MED ORDER — DEXTROSE 5 % IV SOLN
500.0000 mg | INTRAVENOUS | Status: DC
  Administered 2016-02-15 – 2016-02-16 (×2): 500 mg via INTRAVENOUS
  Filled 2016-02-14 (×3): qty 500

## 2016-02-14 MED ORDER — ENOXAPARIN SODIUM 40 MG/0.4ML ~~LOC~~ SOLN
40.0000 mg | SUBCUTANEOUS | Status: DC
  Administered 2016-02-14 – 2016-02-23 (×10): 40 mg via SUBCUTANEOUS
  Filled 2016-02-14 (×10): qty 0.4

## 2016-02-14 MED ORDER — FUROSEMIDE 10 MG/ML IJ SOLN
20.0000 mg | Freq: Once | INTRAMUSCULAR | Status: AC
Start: 2016-02-14 — End: 2016-02-14
  Administered 2016-02-14: 20 mg via INTRAVENOUS
  Filled 2016-02-14: qty 2

## 2016-02-14 MED ORDER — DEXTROSE 5 % IV SOLN
500.0000 mg | Freq: Once | INTRAVENOUS | Status: AC
  Administered 2016-02-14: 500 mg via INTRAVENOUS
  Filled 2016-02-14: qty 500

## 2016-02-14 MED ORDER — DEXTROSE 5 % IV SOLN
1.0000 g | INTRAVENOUS | Status: DC
  Administered 2016-02-15 – 2016-02-16 (×2): 1 g via INTRAVENOUS
  Filled 2016-02-14 (×3): qty 10

## 2016-02-14 MED ORDER — SERTRALINE HCL 50 MG PO TABS
25.0000 mg | ORAL_TABLET | Freq: Every day | ORAL | Status: DC
  Administered 2016-02-14 – 2016-02-22 (×7): 25 mg via ORAL
  Filled 2016-02-14 (×9): qty 1

## 2016-02-14 MED ORDER — CEFTRIAXONE SODIUM 1 G IJ SOLR
1.0000 g | INTRAMUSCULAR | Status: DC
  Filled 2016-02-14: qty 10

## 2016-02-14 MED ORDER — SODIUM CHLORIDE 0.9 % IV SOLN: INTRAVENOUS | Status: DC

## 2016-02-14 MED ORDER — HYDROCODONE-ACETAMINOPHEN 5-325 MG PO TABS
1.0000 | ORAL_TABLET | Freq: Four times a day (QID) | ORAL | Status: DC
  Administered 2016-02-14: 1 via ORAL
  Filled 2016-02-14: qty 1

## 2016-02-14 MED ORDER — LEVOTHYROXINE SODIUM 25 MCG PO TABS
25.0000 ug | ORAL_TABLET | Freq: Every day | ORAL | Status: DC
  Administered 2016-02-15 – 2016-02-22 (×7): 25 ug via ORAL
  Filled 2016-02-14 (×9): qty 1

## 2016-02-14 NOTE — ED Notes (Signed)
Report given to Kendal HymenBonnie, RN for room 317 at this time.

## 2016-02-14 NOTE — Progress Notes (Signed)
Pharmacy Note:  Renal adjust antibiotics Pt is a 82 yo lady receiving rocephin and azithromycin for PNA.  No renal adjustment needed.   Pharmacy will sign off Please advise if we can be of further assiatance Talbert CageLora Rikki Trosper, PharmD

## 2016-02-14 NOTE — ED Provider Notes (Signed)
[facility]-EM[facility]Y DEPT Provider Note   CSN: [id number] Arrival date & time: 02/14/16  [id number]  By signing my name below, I, Kendra Campos, attest that this documentation has been prepared under the direction and in the presence of Kendra BaleElliott Ellsie Violette, MD. Electronically Signed: Aggie MoatsJenny Campos, ED Scribe. 02/14/16. 9:39 AM.  History   Chief Complaint Chief Complaint  Patient presents with  . Altered Mental Status   LEVEL 5 CAVEAT FOR ALTERED MENTAL STATUS  The history is provided by the EMS personnel. No language interpreter was used.   HPI Comments:  Kendra Campos is a 82 y.o. female brought in by ambulance, who presents to the Emergency Department complaining of altered mental status, which started 1 day ago. EMS personnel reports that pt was sent from assisted living due to confusion, which started yesterday and is gradually worsening today. Pt is unable to walk and feed self per usual.  Pt is currently being treated for UTI and is on last day of Cipro.   Past Medical History:  Diagnosis Date  . Arthritis   . Back pain   . Diabetes mellitus   . Hypercholesteremia   . Hypertension   . Pelvic pain in female 07/15/2015  . Pelvic pressure in female 07/15/2015  . Thyroid disease   . UTI (lower urinary tract infection)     Patient Active Problem List   Diagnosis Date Noted  . Pelvic pressure in female 07/15/2015  . Pelvic pain in female 07/15/2015  . Essential hypertension 08/07/2013  . Hyperlipidemia 08/07/2013  . GERD (gastroesophageal reflux disease) 06/09/2012    Past Surgical History:  Procedure Laterality Date  . CHOLECYSTECTOMY    . gall stones      OB History    Gravida Para Term Preterm AB Living   1 1       1    SAB TAB Ectopic Multiple Live Births                   Home Medications    Prior to Admission medications   Medication Sig Start Date End Date Taking? Authorizing Provider  busPIRone (BUSPAR) 7.5 MG tablet Take 7.5 mg by mouth 3 (three) times daily.     Historical Provider, MD  cephALEXin (KEFLEX) 500 MG capsule Take 1 capsule (500 mg total) by mouth 4 (four) times daily. 08/21/15   Tilda BurrowJohn V Ferguson, MD  ciprofloxacin (CIPRO) 500 MG tablet Take 1 tablet (500 mg total) by mouth 2 (two) times daily. 01/03/16   Devoria AlbeIva Knapp, MD  Cranberry Juice Powder 425 MG CAPS Take by mouth.    Historical Provider, MD  HYDROcodone-acetaminophen (NORCO/VICODIN) 5-325 MG tablet Take 1 tablet by mouth 4 (four) times daily.     Historical Provider, MD  levothyroxine (SYNTHROID, LEVOTHROID) 50 MCG tablet Take 50 mcg by mouth daily before breakfast.    Historical Provider, MD  loratadine (CLARITIN) 10 MG tablet Take 10 mg by mouth daily.    Historical Provider, MD  MELATONIN PO Take 5 mg by mouth at bedtime.     Historical Provider, MD  Multiple Vitamin (MULITIVITAMIN WITH MINERALS) TABS Take 1 tablet by mouth daily.      Historical Provider, MD  polyethylene glycol (MIRALAX / GLYCOLAX) packet Take 17 g by mouth daily.    Historical Provider, MD  ranitidine (ZANTAC) 150 MG tablet Take 150 mg by mouth 2 (two) times daily.    Historical Provider, MD  sertraline (ZOLOFT) 25 MG tablet Take 25 mg by mouth daily.  Historical Provider, MD  traMADol (ULTRAM) 50 MG tablet Take 50 mg by mouth every 6 (six) hours as needed.    Historical Provider, MD  valsartan (DIOVAN) 40 MG tablet Take 1 tablet (40 mg total) by mouth daily. <PLEASE MAKE APPOINTMENT FOR REFILLS> Patient taking differently: Take 20 mg by mouth daily. <PLEASE MAKE APPOINTMENT FOR REFILLS> 10/06/14   Runell Gess, MD    Family History No family history on file.  Social History Social History  Substance Use Topics  . Smoking status: Never Smoker  . Smokeless tobacco: Never Used  . Alcohol use No     Allergies   Review of patient's allergies indicates no known allergies.   Review of Systems Review of Systems  Unable to perform ROS: Mental status change    Physical Exam Updated Vital Signs Pulse 84    Resp 18   Ht 5\' 4"  (1.626 m)   Wt 195 lb (88.5 kg)   SpO2 94%   BMI 33.47 kg/m   Physical Exam  Constitutional: She appears well-developed.  Elderly, frail.  HENT:  Head: Normocephalic and atraumatic.  Eyes: Conjunctivae and EOM are normal.  Neck: Normal range of motion and phonation normal. Neck supple.  Cardiovascular: Normal rate and regular rhythm.   Pulmonary/Chest: Effort normal and breath sounds normal. She exhibits no tenderness.  Abdominal: Soft. She exhibits no distension. There is no tenderness. There is no guarding.  Musculoskeletal: Normal range of motion.  Neurological: She is alert. She exhibits normal muscle tone.  Skin: Skin is warm and dry. No rash noted.  Psychiatric:  Anxious  Nursing note and vitals reviewed.    ED Treatments / Results  DIAGNOSTIC STUDIES:  Oxygen Saturation is 94% on room air, normal by my interpretation.    COORDINATION OF CARE:  9:37 AM Treatment includes blood work and urinalysis.  Labs (all labs ordered are listed, but only abnormal results are displayed) Labs Reviewed - No data to display  EKG  EKG Interpretation None       Radiology No results found.  Procedures Procedures (including critical care time)  Medications Ordered in ED Medications - No data to display   Initial Impression / Assessment and Plan / ED Course  I have reviewed the triage vital signs and the nursing notes.  Pertinent labs & imaging results that were available during my care of the patient were reviewed by me and considered in my medical decision making (see chart for details).  Clinical Course  Value Comment By Time  WBC: (!) 12.1 (Reviewed) Kendra Bale, MD 09/12 1426  DG Chest 2 View (Reviewed) Kendra Bale, MD 09/12 1427    Medications  cefTRIAXone (ROCEPHIN) 1 g in dextrose 5 % 50 mL IVPB (1 g Intravenous New Bag/Given 02/14/16 1413)  azithromycin (ZITHROMAX) 500 mg in dextrose 5 % 250 mL IVPB (not administered)  sodium  chloride 0.9 % bolus 500 mL (0 mLs Intravenous Stopped 02/14/16 1050)    Patient Vitals for the past 24 hrs:  BP Temp Temp src Pulse Resp SpO2 Height Weight  02/14/16 1300 173/79 - - 83 18 96 % - -  02/14/16 1241 162/100 - - 87 20 91 % - -  02/14/16 1127 175/74 - - 82 20 93 % - -  02/14/16 1000 107/85 - - 68 22 91 % - -  02/14/16 0937 - 97.1 F (36.2 C) Rectal - - - - -  02/14/16 4098 - - - - - - 5\' 4"  (1.626 m)  195 lb (88.5 kg)  02/14/16 0903 177/73 97.5 F (36.4 C) Axillary 84 18 94 % - -    2:40 PM Reevaluation with update and discussion. After initial assessment and treatment, an updated evaluation reveals no change in status. His daughter and son-in-law are here now and are very concerned that she is "out of it". They state that normally she is responsive and medical details, but now is unable to do that. They have seen her, get like this before with urinary tract infections, most recently a few weeks ago. The patient lives in assisted living, family never station cannot be managed there, like this. She has been progressively "worse" for several months, now not walking at all, but only getting around in a wheelchair. Family members are also that her confusion can be treated with intravenous antibiotics. They do not have any end-of-life care plans in place. Family members updated on findings, and plan. All questions answered.Kendra Bale L    2:45 PM-Consult complete with Hospitalist. Patient case explained and discussed. She agrees to admit patient for further evaluation and treatment. Call ended at 14:58   Final Clinical Impressions(s) / ED Diagnoses   Final diagnoses:  Altered mental status, unspecified altered mental status type  CAP (community acquired pneumonia)    Altered mental status, with possible pneumonia as inciting factor. Chest x-ray indicates possible pneumonia with effusion, right-sided. No apparent UTI. Patient is on chronic antibiotic suppression for urinary  tract infections. This elderly patient, has been going through a gradual decline, and both she and the family members would benefit from discussions of end-of-life care.  Nursing Notes Reviewed/ Care Coordinated, and agree without changes. Applicable Imaging Reviewed.  Interpretation of Laboratory Data incorporated into ED treatment  Plan: Admit  New Prescriptions New Prescriptions   No medications on file  I personally performed the services described in this documentation, which was scribed in my presence. The recorded information has been reviewed and is accurate.       Kendra Bale, MD 02/14/16 1459

## 2016-02-14 NOTE — ED Notes (Signed)
Update on pt's status given to assisted living nurse at this time. Family present at bedside at this time.

## 2016-02-14 NOTE — H&P (Signed)
History and Physical  Kendra Campos VOZ:[id number] DOB: [birth year] DOA: 02/14/2016  Referring physician: Dr. Effie Shy EDP PCP: Colette Ribas, MD   Chief Complaint: Altered Mental Status x 7 days  HPI:  82 year old female presents from an assisted living facility for decline in mental status for the past 7 days.  Per patient's daughter, whom the history was elicited from as patient was not responsive to questioning, patient began to decline since April when she was first brought to the assisted living.  For example, patient used to be ambulatory and now is wheelchair bound.  Over the past week patient has declined even more, eating little, not feeding herself and seeming more lethargic than before.  Patient's daughter also states that patient began developing swelling of her lower extremities recently and she believes she was placed on a water pill but couldn't state when exactly that was.  Patient has a history of recurrent UTI's for which patient was just treated with Cipro and was about a day away from finishing the course.  Patient's daughter states normally patient is able to converse and is alert and oriented.  Patient was unable to provide ROS.  In the emergency department patient was seen and evaluated by Dr. Effie Shy.  Family was bedside but left just prior to my evaluation.  She was on room air and was breathing normally.  She did not appear in pain.  She was able to be aroused and responded to name.  She was not able to speak coherently.  EKG: Independently reviewed. Not performed Imaging: independently reviewed.   Review of Systems:  Unable to elicit as patient is not responsive   Past Medical History:  Diagnosis Date  . Arthritis   . Back pain   . Diabetes mellitus   . Hypercholesteremia   . Hypertension   . Pelvic pain in female 07/15/2015  . Pelvic pressure in female 07/15/2015  . Thyroid disease   . UTI (lower urinary tract infection)     Past Surgical History:    Procedure Laterality Date  . CHOLECYSTECTOMY    . gall stones      Social History:  reports that she has never smoked. She has never used smokeless tobacco. She reports that she does not drink alcohol or use drugs. lives in an assisted living facility Partial assistance  No Known Allergies  History reviewed. No pertinent family history.   Prior to Admission medications   Medication Sig Start Date End Date Taking? Authorizing Provider  busPIRone (BUSPAR) 7.5 MG tablet Take 7.5 mg by mouth 3 (three) times daily.   Yes Historical Provider, MD  ciprofloxacin (CIPRO) 500 MG tablet Take 1 tablet (500 mg total) by mouth 2 (two) times daily. 01/03/16  Yes Devoria Albe, MD  Cranberry Juice Powder 425 MG CAPS Take by mouth.   Yes Historical Provider, MD  furosemide (LASIX) 20 MG tablet Take 20 mg by mouth daily.   Yes Historical Provider, MD  HYDROcodone-acetaminophen (NORCO/VICODIN) 5-325 MG tablet Take 1 tablet by mouth 4 (four) times daily.    Yes Historical Provider, MD  levothyroxine (SYNTHROID, LEVOTHROID) 25 MCG tablet Take 25 mcg by mouth daily before breakfast.   Yes Historical Provider, MD  LORazepam (ATIVAN) 0.5 MG tablet Take 0.5 mg by mouth 3 (three) times daily.   Yes Historical Provider, MD  nystatin-triamcinolone (MYCOLOG II) cream Apply 1 application topically 2 (two) times daily.   Yes Historical Provider, MD  polyethylene glycol (MIRALAX / GLYCOLAX) packet  Take 17 g by mouth daily.   Yes Historical Provider, MD  potassium chloride (K-DUR) 10 MEQ tablet Take 10 mEq by mouth daily.   Yes Historical Provider, MD  ranitidine (ZANTAC) 150 MG tablet Take 150 mg by mouth 2 (two) times daily.   Yes Historical Provider, MD  sertraline (ZOLOFT) 25 MG tablet Take 25 mg by mouth daily.   Yes Historical Provider, MD  traZODone (DESYREL) 50 MG tablet Take 50 mg by mouth at bedtime.   Yes Historical Provider, MD  trimethoprim (TRIMPEX) 100 MG tablet Take 100 mg by mouth at bedtime.   Yes  Historical Provider, MD   Physical Exam: Vitals:   02/14/16 1241 02/14/16 1300 02/14/16 1538 02/14/16 1737  BP: 162/100 173/79 161/74   Pulse: 87 83 89   Resp: 20 18 20    Temp:   98.2 F (36.8 C)   TempSrc:   Oral   SpO2: 91% 96% 93%   Weight:    89.8 kg (197 lb 15.6 oz)  Height:    5\' 4"  (1.626 m)    Physical exam General:  Appears calm and comfortable, arousable to name Eyes: PERRL, normal lids, irises & conjunctiva ENT: grossly normal hearing, dry mucous membranes, lips & tongue Neck: no LAD, masses or thyromegaly Cardiovascular: RRR, no m/r/g. Significant LE edema bilaterally. Telemetry: SR, no arrhythmias  Respiratory: rales in right lower lung field, no w/r/r. Normal respiratory effort. Abdomen: soft, ntnd Skin: significant skin breakdown on right heel that is chronic Musculoskeletal: grossly normal tone BUE, 4/5 in lower extremities bilaterally Psychiatric: speech unintelligible Neurologic: unable to assess  Wt Readings from Last 3 Encounters:  02/14/16 89.8 kg (197 lb 15.6 oz)  01/03/16 88.5 kg (195 lb)  08/18/15 78.9 kg (174 lb)    Labs on Admission:  Basic Metabolic Panel:  Recent Labs Lab 02/14/16 0939  NA 130*  K 3.5  CL 90*  CO2 29  GLUCOSE 88  BUN 14  CREATININE 0.88  CALCIUM 9.4    Liver Function Tests: No results for input(s): AST, ALT, ALKPHOS, BILITOT, PROT, ALBUMIN in the last 168 hours. No results for input(s): LIPASE, AMYLASE in the last 168 hours. No results for input(s): AMMONIA in the last 168 hours.  CBC:  Recent Labs Lab 02/14/16 0939  WBC 12.1*  NEUTROABS 9.0*  HGB 12.5  HCT 37.9  MCV 84.4  PLT 311    Cardiac Enzymes: No results for input(s): CKTOTAL, CKMB, CKMBINDEX, TROPONINI in the last 168 hours.  Troponin (Point of Care Test) No results for input(s): TROPIPOC in the last 72 hours.  BNP (last 3 results) No results for input(s): PROBNP in the last 8760 hours.  CBG: No results for input(s): GLUCAP in the last  168 hours.   Radiological Exams on Admission: Dg Chest 2 View  Result Date: 02/14/2016 CLINICAL DATA:  Altered mental status, history of hypertension, hyperlipidemia. EXAM: CHEST  2 VIEW COMPARISON:  Portable chest x-ray of January 03, 2016 FINDINGS: The lungs are reasonably well inflated. The right hemidiaphragm is higher than the left. There is density in the posterior aspect of the right hemi thorax that suggest pleural fluid. Infiltrate here is not excluded. The cardiac silhouette is mildly enlarged though stable. The central pulmonary vascularity is prominent. No cephalization of the vascular pattern is observed. There is dense calcification in the wall of the thoracic aorta. There degenerative changes of both shoulders. There is multilevel degenerative disc disease of the thoracic spine. IMPRESSION: Chronic bronchitic changes. Right  lower lobe atelectasis or pneumonia with small right pleural effusion. Followup PA and lateral chest X-ray is recommended in 3-4 weeks following trial of antibiotic therapy to ensure resolution and exclude underlying malignancy. Cardiomegaly without definite pulmonary edema. Aortic atherosclerosis. Electronically Signed   By: David  [place] M.D.   On: 02/14/2016 10:40   Ct Head Wo Contrast  Result Date: 02/14/2016 CLINICAL DATA:  Altered mental status for 1 day, worse today EXAM: CT HEAD WITHOUT CONTRAST TECHNIQUE: Contiguous axial images were obtained from the base of the skull through the vertex without intravenous contrast. COMPARISON:  None. FINDINGS: Brain: The ventricular system is prominent as are the cortical sulci, indicative of diffuse atrophy. Moderate small vessel ischemic change is noted throughout the periventricular white matter. No hemorrhage, mass lesion, or acute infarction is seen. Vascular: On this unenhanced study, no vascular abnormality is seen. Skull: No calvarial abnormality is noted. Sinuses/Orbits: Paranasal sinuses are pneumatized. Other: None  IMPRESSION: Atrophy and moderate small vessel ischemic change. No acute intracranial abnormality. Electronically Signed   By: Dwyane Dee M.D.   On: 02/14/2016 11:25      Principal Problem:   Altered mental state Active Problems:   GERD (gastroesophageal reflux disease)   Essential hypertension   Community acquired pneumonia   Lower extremity edema   Debility   Assessment/Plan   Altered mental state/ acute delirium - unclear etiology - Urine mostly clean and patient on cipro previously - will give IVF - CT head negative - dehydration? As patient was just started on lasix for LE edema - keep NPO at this time  LE edema - TED hose - will continue lasix that was given outpatient - echocardiogram ordered for am - PT ordered  Debility - PT ordered  Community acquired pneumonia - will continue Azithro and Rocephin - xray shows possibly infiltrate - will treat and await response - cannot r/o malignancy - suggest follow up xray 3-4 weeks after treatment to ensure clearance  Code Status: DNR  DVT prophylaxis:Lovenox Family Communication: talked to daughter Kathie Rhodes at phone number on facesheet Disposition Plan/Anticipated LOS: unclear if patient could discharge back to assisted living when ready for discharge  Time spent: 35 minutes  Reuben Likes, MD  [facility]  02/14/2016, 6:03 PM

## 2016-02-14 NOTE — ED Triage Notes (Signed)
Pt sent from [facility] assisted living for AMS. Per EMS staff reports confusion since yesterday afternoon. Worsening today with altered speech, worsening confusion and agitation and inability to walk/feed self as per usual.

## 2016-02-14 NOTE — ED Notes (Signed)
Returned from CT, family at bedside 

## 2016-02-15 ENCOUNTER — Inpatient Hospital Stay (HOSPITAL_COMMUNITY): Payer: Medicare Other

## 2016-02-15 DIAGNOSIS — I10 Essential (primary) hypertension: Secondary | ICD-10-CM

## 2016-02-15 DIAGNOSIS — R410 Disorientation, unspecified: Secondary | ICD-10-CM

## 2016-02-15 DIAGNOSIS — R0600 Dyspnea, unspecified: Secondary | ICD-10-CM

## 2016-02-15 LAB — COMPREHENSIVE METABOLIC PANEL
ALBUMIN: 3.5 g/dL (ref 3.5–5.0)
ALT: 15 U/L (ref 14–54)
AST: 29 U/L (ref 15–41)
Alkaline Phosphatase: 101 U/L (ref 38–126)
Anion gap: 11 (ref 5–15)
BILIRUBIN TOTAL: 0.9 mg/dL (ref 0.3–1.2)
BUN: 11 mg/dL (ref 6–20)
CHLORIDE: 90 mmol/L — AB (ref 101–111)
CO2: 30 mmol/L (ref 22–32)
Calcium: 8.7 mg/dL — ABNORMAL LOW (ref 8.9–10.3)
Creatinine, Ser: 0.65 mg/dL (ref 0.44–1.00)
GFR calc Af Amer: >60 mL/min (ref >60)
GFR calc non Af Amer: >60 mL/min (ref >60)
GLUCOSE: 84 mg/dL (ref 65–99)
POTASSIUM: 3 mmol/L — AB (ref 3.5–5.1)
Sodium: 131 mmol/L — ABNORMAL LOW (ref 135–145)
TOTAL PROTEIN: 6.2 g/dL — AB (ref 6.5–8.1)

## 2016-02-15 LAB — CBC
HEMATOCRIT: 37.2 % (ref 36.0–46.0)
Hemoglobin: 12 g/dL (ref 12.0–15.0)
MCH: 27.5 pg (ref 26.0–34.0)
MCHC: 32.3 g/dL (ref 30.0–36.0)
MCV: 85.1 fL (ref 78.0–100.0)
Platelets: 267 10*3/uL (ref 150–400)
RBC: 4.37 MIL/uL (ref 3.87–5.11)
RDW: 14.3 % (ref 11.5–15.5)
WBC: 9.2 10*3/uL (ref 4.0–10.5)

## 2016-02-15 LAB — URINE CULTURE: Culture: NO GROWTH

## 2016-02-15 LAB — ECHOCARDIOGRAM COMPLETE
HEIGHTINCHES: 64 in
WEIGHTICAEL: 2991.2 [oz_av]

## 2016-02-15 LAB — STREP PNEUMONIAE URINARY ANTIGEN: Strep Pneumo Urinary Antigen: NEGATIVE

## 2016-02-15 MED ORDER — POTASSIUM CHLORIDE CRYS ER 20 MEQ PO TBCR
40.0000 meq | EXTENDED_RELEASE_TABLET | Freq: Once | ORAL | Status: AC
  Administered 2016-02-15: 40 meq via ORAL
  Filled 2016-02-15: qty 2

## 2016-02-15 MED ORDER — ALBUTEROL SULFATE (2.5 MG/3ML) 0.083% IN NEBU
2.5000 mg | INHALATION_SOLUTION | RESPIRATORY_TRACT | Status: DC | PRN
  Administered 2016-02-16: 2.5 mg via RESPIRATORY_TRACT
  Filled 2016-02-15: qty 3

## 2016-02-15 MED ORDER — LORAZEPAM 0.5 MG PO TABS: 0.5000 mg | ORAL_TABLET | Freq: Two times a day (BID) | ORAL | Status: DC | PRN

## 2016-02-15 MED ORDER — HYDROCODONE-ACETAMINOPHEN 5-325 MG PO TABS
1.0000 | ORAL_TABLET | Freq: Two times a day (BID) | ORAL | Status: DC | PRN
  Administered 2016-02-15 – 2016-02-18 (×4): 1 via ORAL
  Filled 2016-02-15 (×5): qty 1

## 2016-02-15 MED ORDER — HYDROCODONE-ACETAMINOPHEN 5-325 MG PO TABS: 1.0000 | ORAL_TABLET | Freq: Four times a day (QID) | ORAL | Status: DC | PRN

## 2016-02-15 MED ORDER — HYDROCODONE-ACETAMINOPHEN 5-325 MG PO TABS: 1.0000 | ORAL_TABLET | Freq: Three times a day (TID) | ORAL | Status: DC | PRN

## 2016-02-15 NOTE — Clinical Social Work Placement (Signed)
   CLINICAL SOCIAL WORK PLACEMENT  NOTE  Date:  02/15/2016  Patient Details  Name: Kendra Campos MRN: [id number] Date of Birth: 02/08/1924  Clinical Social Work is seeking post-discharge placement for this patient at the Skilled  Nursing Facility level of care (*CSW will initial, date and re-position this form in  chart as items are completed):  Yes   Patient/family provided with [facility] Clinical Social Work Department's list of facilities offering this level of care within the geographic area requested by the patient (or if unable, by the patient's family).  Yes   Patient/family informed of their freedom to choose among providers that offer the needed level of care, that participate in Medicare, Medicaid or managed care program needed by the patient, have an available bed and are willing to accept the patient.  Yes   Patient/family informed of [facility]'s ownership interest in [facility] and [facility], as well as of the fact that they are under no obligation to receive care at these facilities.  PASRR submitted to EDS on       PASRR number received on       Existing PASRR number confirmed on 02/15/16     FL2 transmitted to all facilities in geographic area requested by pt/family on 02/15/16     FL2 transmitted to all facilities within larger geographic area on       Patient informed that his/her managed care company has contracts with or will negotiate with certain facilities, including the following:            Patient/family informed of bed offers received.  Patient chooses bed at       Physician recommends and patient chooses bed at      Patient to be transferred to   on  .  Patient to be transferred to facility by       Patient family notified on   of transfer.  Name of family member notified:        PHYSICIAN       Additional Comment:    _______________________________________________ Karn CassisStultz, Kaydense Rizo Shanaberger, LCSW 02/15/2016, 2:41  PM [phone]

## 2016-02-15 NOTE — Clinical Social Work Note (Signed)
Clinical Social Work Assessment  Patient Details  Name: Kendra Campos MRN: [id number] Date of Birth: 12/01/1923  Date of referral:  02/15/16               Reason for consult:  Discharge Planning                Permission sought to share information with:    Permission granted to share information::  Yes, Verbal Permission Granted  Name::        Agency::  [facility]  Relationship::  facility  Contact Information:     Housing/Transportation Living arrangements for the past 2 months:  Assisted Living Facility Source of Information:  Adult Children Patient Interpreter Needed:  None Criminal Activity/Legal Involvement Pertinent to Current Situation/Hospitalization:  No - Comment as needed Significant Relationships:  Adult Children Lives with:  Facility Resident Do you feel safe going back to the place where you live?  Yes Need for family participation in patient care:  Yes (Comment)  Care giving concerns:  Pt is from ALF and will require higher level of care.    Social Worker assessment / plan:  CSW spoke with pt's daughter, Kendra Campos on phone as pt oriented to self only. Kendra Campos reports that pt has been a [facility] since April. She was placed from home and ever since then, Kendra Campos feels she has declined some. However, in the past week she has had an obvious decline and is very weak. At baseline, pt ambulates with assist. She has home health PT through [facility]. Kendra Campos feels that pt has been over medicated. PT evaluated pt and recommend SNF. CSW discussed placement process and [facility]. She gave permission for CSW to share d/c plan with [facility]. Kendra Campos at facility is aware of plan for SNF and will follow up with family.   Employment status:  Retired Database administratornsurance information:  Managed Medicare PT Recommendations:  Skilled Nursing Facility Information / Referral to community resources:  Skilled Nursing Facility  Patient/Family's Response to care:   Pt's daughter is very open to SNF for rehab when medically stable.   Patient/Family's Understanding of and Emotional Response to Diagnosis, Current Treatment, and Prognosis:  Pt's daughter appears to be very knowledgeable of pt's health history and admission diagnosis.   Emotional Assessment Appearance:  Appears stated age Attitude/Demeanor/Rapport:  Unable to Assess Affect (typically observed):  Unable to Assess Orientation:  Oriented to Self Alcohol / Substance use:  Not Applicable Psych involvement (Current and /or in the community):  No (Comment)  Discharge Needs  Concerns to be addressed:  Discharge Planning Concerns Readmission within the last 30 days:  No Current discharge risk:  Physical Impairment Barriers to Discharge:  Continued Medical Work up   Karn CassisStultz, Chau Savell Shanaberger, LCSW 02/15/2016, 2:44 PM [phone]

## 2016-02-15 NOTE — Progress Notes (Signed)
*  PRELIMINARY RESULTS* Echocardiogram 2D Echocardiogram has been performed.  Kendra Campos, Kendra Campos 02/15/2016, 10:23 AM

## 2016-02-15 NOTE — Evaluation (Signed)
Physical Therapy Evaluation Patient Details Name: Kendra Campos MRN: [id number] DOB: [birth year] Today's Date: 02/15/2016   History of Present Illness  82 year old female presents from an assisted living facility for decline in mental status for the past 7 days.  Per patient's daughter, whom the history was elicited from as patient was not responsive to questioning, patient began to decline since April when she was first brought to the assisted living.  For example, patient used to be ambulatory and now is wheelchair bound.  Over the past week patient has declined even more, eating little, not feeding herself and seeming more lethargic than before.  Patient's daughter also states that patient began developing swelling of her lower extremities recently and she believes she was placed on a water pill but couldn't state when exactly that was.  Patient has a history of recurrent UTI's for which patient was just treated with Cipro and was about a day away from finishing the course.  Dx: AMS/acute delirium with unknown etiology.  PMH: Arthritis, back pain, DM, HTN, pelvic pain, thyroid disease, UTI.    Clinical Impression  Pt received in bed, and was agreeable to PT evaluation.  Pt is very HOH despite having hearing aides in.  She states that she normally mobilizes in a w/c.  Granddaughter arrived at the end of evaluation, and expressed that for the past week she has required assist +2 person for transfers bed<>chair.  Today during PT evaluation, she was only oriented to self - stated that she was in the dentist offices.  She required Max A for sit<>stand and stand pivot transfer bed<>chair.  Pt had difficulty processing sit<>stand transfer once in the recliner, and required total assist for peri-hygiene.  At this point, she is not safe to return back to her ALF [facility]), and she will need SNF to get rehab to improve her strength and transfers.  Spoke with granddaughter and her husband regarding this, and  they are in agreement.     Follow Up Recommendations SNF    Equipment Recommendations  None recommended by PT    Recommendations for Other Services OT consult     Precautions / Restrictions Precautions Precautions: Fall Precaution Comments: due to immobility Restrictions RLE Weight Bearing: Touchdown weight bearing LLE Weight Bearing: Touchdown weight bearing Other Position/Activity Restrictions: Uncertain of reasoning for weight bearing status - pt's dry erase board states that she is to be NWB unless her braces are on.  No orders for WB formally in the chart.       Mobility  Bed Mobility Overal bed mobility: Needs Assistance;+2 for physical assistance Bed Mobility: Supine to Sit     Supine to sit: Mod assist;+2 for physical assistance;HOB elevated        Transfers Overall transfer level: Needs assistance Equipment used: Rolling walker (2 wheeled) Transfers: Sit to/from Stand Sit to Stand: Max assist;+2 physical assistance;+2 safety/equipment (Pt requires increased time.  Max A +2 for sit<>stand from recliner x 3 reps and total A for peri-hygiene. ) Stand pivot transfers: Max assist;+2 physical assistance;+2 safety/equipment (While pt performing transfer, she was noted to be soiled and had a BM and urniated.  Pt was safely assisted to the recliner.  )       General transfer comment: For the last attempts of sit<>stand, pt was not able to process how to perform sit<>stand, and therefore RW taken away, and PT stood in front, blocking both knees, and pt was able to perform sit<>stand transfer with max  A.   Ambulation/Gait Ambulation/Gait assistance:  (NA)              Stairs            Wheelchair Mobility    Modified Rankin (Stroke Patients Only)       Balance Overall balance assessment: Needs assistance Sitting-balance support: Bilateral upper extremity supported Sitting balance-Leahy Scale: Fair     Standing balance support: Bilateral upper  extremity supported Standing balance-Leahy Scale: Poor                               Pertinent Vitals/Pain Pain Assessment: No/denies pain    Home Living     Available Help at Discharge: Personal care attendant Type of Home: Assisted living         Home Equipment: Wheelchair - manual;Hospital bed;Walker - 2 wheels      Prior Function Level of Independence: Needs assistance   Gait / Transfers Assistance Needed: Granddaughter states that the pt was working with PT 1 or 2 days per week, and she was normally able to transfer with the RW from the bed<>w/c.  However, for the last week, she has required +2 person assist for transfers.    ADL's / Homemaking Assistance Needed: pt requires assistance for dressing, and bathing.          Hand Dominance        Extremity/Trunk Assessment   Upper Extremity Assessment: Generalized weakness           Lower Extremity Assessment: Generalized weakness         Communication   Communication: HOH  Cognition Arousal/Alertness: Lethargic Behavior During Therapy: WFL for tasks assessed/performed Overall Cognitive Status: Difficult to assess (Pt is HOH.  She states that she is at the Dentist office.  )                      General Comments      Exercises        Assessment/Plan    PT Assessment Patient needs continued PT services  PT Diagnosis Generalized weakness   PT Problem List Decreased strength;Decreased activity tolerance;Decreased balance;Decreased mobility;Decreased cognition;Decreased knowledge of use of DME;Decreased safety awareness;Decreased knowledge of precautions;Obesity  PT Treatment Interventions DME instruction;Gait training;Functional mobility training;Therapeutic activities;Therapeutic exercise;Balance training;Patient/family education   PT Goals (Current goals can be found in the Care Plan section) Acute Rehab PT Goals Patient Stated Goal: Improve strength and transfers.  PT Goal  Formulation: With family Time For Goal Achievement: 02/22/16 Potential to Achieve Goals: Fair    Frequency Min 3X/week   Barriers to discharge        Co-evaluation               End of Session Equipment Utilized During Treatment: Gait belt Activity Tolerance: Patient limited by lethargy Patient left: in chair;with family/visitor present Nurse Communication: Mobility status    Functional Assessment Tool Used: [employer] AM-PAC "6-clicks"  Functional Limitation: Mobility: Walking and moving around Mobility: Walking and Moving Around Current Status [phone](G8978): At least 60 percent but less than 80 percent impaired, limited or restricted Mobility: Walking and Moving Around Goal Status [phone]([id number]): At least 40 percent but less than 60 percent impaired, limited or restricted    Time: [id number] PT Time Calculation (min) (ACUTE ONLY): 42 min   Charges:   PT Evaluation $PT Eval Moderate Complexity: 1 Procedure PT Treatments $Therapeutic Activity: 23-37 mins  PT G Codes:   PT G-Codes **NOT FOR INPATIENT CLASS** Functional Assessment Tool Used: [employer] "6-clicks"  Functional Limitation: Mobility: Walking and moving around Mobility: Walking and Moving Around Current Status [phone]): At least 60 percent but less than 80 percent impaired, limited or restricted Mobility: Walking and Moving Around Goal Status [phone]): At least 40 percent but less than 60 percent impaired, limited or restricted    Beth Jeneane Pieczynski, PT, DPT X: [phone]

## 2016-02-15 NOTE — NC FL2 (Signed)
[place] MEDICAID FL2 LEVEL OF CARE SCREENING TOOL     IDENTIFICATION  Patient Name: Kendra Campos Sunday Birthdate: 10/19/1923 Sex: female Admission Date (Current Location): 02/14/2016  [facility] [place]   Facility and Address:  [facility],  [address], [place] [id number]      Provider Number: [id number]  Attending Physician Name and Address:  Jeralyn BennettEzequiel Zamora, MD  Relative Name and Phone Number:       Current Level of Care: Hospital Recommended Level of Care: Skilled Nursing Facility Prior Approval Number:    Date Approved/Denied:   PASRR Number: [id number]  Discharge Plan: SNF    Current Diagnoses: Patient Active Problem List   Diagnosis Date Noted  . Altered mental state 02/14/2016  . Community acquired pneumonia 02/14/2016  . Lower extremity edema 02/14/2016  . Debility 02/14/2016  . Pelvic pressure in female 07/15/2015  . Pelvic pain in female 07/15/2015  . Essential hypertension 08/07/2013  . Hyperlipidemia 08/07/2013  . GERD (gastroesophageal reflux disease) 06/09/2012    Orientation RESPIRATION BLADDER Height & Weight     Self  Normal Incontinent Weight: 186 lb 15.2 oz (84.8 kg) Height:  5\' 4"  (162.6 cm)  BEHAVIORAL SYMPTOMS/MOOD NEUROLOGICAL BOWEL NUTRITION STATUS  Other (Comment) (n/a)  (n/a) Incontinent Diet (Regular)  AMBULATORY STATUS COMMUNICATION OF NEEDS Skin   Total Care Verbally Other (Comment) (Moisture associated skin damage abdomen)                       Personal Care Assistance Level of Assistance  Bathing, Feeding, Dressing Bathing Assistance: Maximum assistance Feeding assistance: Limited assistance Dressing Assistance: Maximum assistance     Functional Limitations Info  Sight, Hearing, Speech Sight Info: Adequate Hearing Info: Impaired Speech Info: Adequate    SPECIAL CARE FACTORS FREQUENCY  PT (By licensed PT)     PT Frequency: 5              Contractures Contractures  Info: Not present    Additional Factors Info  Psychotropic Code Status Info: DNR Allergies Info: No known allergies Psychotropic Info: Trazodone, Ativan   Isolation Precautions Info: Extended spectrum beta lactamase     Current Medications (02/15/2016):  This is the current hospital active medication list Current Facility-Administered Medications  Medication Dose Route Frequency Provider Last Rate Last Dose  . azithromycin (ZITHROMAX) 500 mg in dextrose 5 % 250 mL IVPB  500 mg Intravenous Q24H Maye HidesAlexandria M Ukleja, MD      . busPIRone (BUSPAR) tablet 7.5 mg  7.5 mg Oral TID Maye HidesAlexandria M Ukleja, MD   7.5 mg at 02/14/16 1819  . cefTRIAXone (ROCEPHIN) 1 g in dextrose 5 % 50 mL IVPB  1 g Intravenous Q24H Maye HidesAlexandria M Ukleja, MD      . enoxaparin (LOVENOX) injection 40 mg  40 mg Subcutaneous Q24H Maye HidesAlexandria M Ukleja, MD   40 mg at 02/14/16 1821  . HYDROcodone-acetaminophen (NORCO/VICODIN) 5-325 MG per tablet 1 tablet  1 tablet Oral BID PRN Jeralyn BennettEzequiel Zamora, MD      . Influenza vac split quadrivalent PF (FLUARIX) injection 0.5 mL  0.5 mL Intramuscular Tomorrow-1000 Maye HidesAlexandria M Ukleja, MD      . levothyroxine (SYNTHROID, LEVOTHROID) tablet 25 mcg  25 mcg Oral QAC breakfast Maye HidesAlexandria M Ukleja, MD      . LORazepam (ATIVAN) tablet 0.5 mg  0.5 mg Oral BID PRN Jeralyn BennettEzequiel Zamora, MD      . potassium chloride SA (K-DUR,KLOR-CON) CR tablet 40 mEq  40 mEq Oral Once Jeralyn Bennett, MD      . sertraline (ZOLOFT) tablet 25 mg  25 mg Oral Daily Maye Hides, MD   25 mg at 02/14/16 1817  . traZODone (DESYREL) tablet 50 mg  50 mg Oral QHS Maye Hides, MD         Discharge Medications: Please see discharge summary for a list of discharge medications.  Relevant Imaging Results:  Relevant Lab Results:   Additional Information SSN: [id number]  [place]arn [place], [place] [phone]

## 2016-02-15 NOTE — Care Management Important Message (Signed)
Important Message  Patient Details  Name: Kendra Campos MRN: [id number] Date of Birth: 03/30/1924   Medicare Important Message Given:  Yes    Emiliana Blaize, Chrystine OilerSharley Diane, RN 02/15/2016, 3:13 PM

## 2016-02-15 NOTE — Progress Notes (Signed)
PROGRESS NOTE    Kendra Campos  ZOX:[id number] DOB: 06/29/1923 DOA: 02/14/2016 PCP: Kendra Campos, JOHN CABOT, MD   Brief Narrative:  Kendra Campos is a 82 year old female who had been a resident at an assisted facility transferred to the emergency room overnight for further workup and evaluation of functional decline. Her daughter had reported a gradual functional decline since April, however had a steep decline in the last 24 hours. She became minimally responsive at her skilled nursing facility, having minimal by mouth intake, profoundly weak and having confusion. She is on multiple psychotropic medications. According to her MAR she had been on buspirone, Norco taking 1 tablet 4 times daily, Ativan taking 0.5 mg 3 times daily, Zoloft, trazodone. Suspect that polypharmacy likely contributed to functional decline.    Assessment & Plan:   Principal Problem:   Altered mental state Active Problems:   GERD (gastroesophageal reflux disease)   Essential hypertension   Community acquired pneumonia   Lower extremity edema   Debility   1.  Functional decline/failure to thrive -I suspect that polypharmacy a major contributor to her functional decline. Reviewing her medication administration reconciliation, she had been on multiple psychotropic medications to include buspirone, Norco, Ativan, Zoloft, trazodone. Norco and Ativan were both scheduled. -Have decreased Norco to twice a day when necessary dosing and Ativan to when necessary twice a day dosing as well.  -She was assisted out of bed to chair, 2 person assist, she is markedly deconditioned -Suspect may need skilled nursing facility placement  2.  Acute encephalopathy -I suspect she had delirium secondary to psychotropic medications -CT scan of brain cannot reveal acute intracranial abnormality -Improved on this morning's evaluation -Assisted out of bed to chair  3.  Possible pneumonia -Radiology reporting right lower lobe atelectasis  versus pneumonia with small right pleural effusion -Afebrile -She was started on empiric IV antibiotic therapy with ceftriaxone and azithromycin -Plan to monitor  4.  Hypothyroidism -Continue Synthroid 25 g by mouth daily  5.  Hypokalemia -Lab work showed potassium of 3.0, will replace with 40 mEq of potassium chloride  DVT prophylaxis: Lovenox Code Status: DO NOT RESUSCITATE Family Communication: I spoke to her daughter over the telephone Disposition Plan: Anticipate she may require skilled nursing facility placement  Consultants:   Physical therapy   Antimicrobials:   Ceftriaxone  Azithromycin   Subjective: She is awake, can follow commands, was assisted out of bed to chair  Objective: Vitals:   02/14/16 1737 02/14/16 1926 02/14/16 2041 02/15/16 0430  BP:   (!) 159/73 (!) 167/66  Pulse:   85 73  Resp:   18 20  Temp:   98.3 F (36.8 C) 97.6 F (36.4 C)  TempSrc:   Oral Axillary  SpO2:  96% 95% 95%  Weight: 89.8 kg (197 lb 15.6 oz)   84.8 kg (186 lb 15.2 oz)  Height: 5\' 4"  (1.626 m)   5\' 4"  (1.626 m)    Intake/Output Summary (Last 24 hours) at 02/15/16 1243 Last data filed at 02/15/16 0500  Gross per 24 hour  Intake                0 ml  Output                0 ml  Net                0 ml   Filed Weights   02/14/16 0904 02/14/16 1737 02/15/16 0430  Weight: 88.5 kg (195 lb) 89.8 kg (  197 lb 15.6 oz) 84.8 kg (186 lb 15.2 oz)    Examination:  General exam: She is awake, alert Respiratory system: Having coarse respiratory sounds Cardiovascular system: S1 & S2 heard, RRR. No JVD, murmurs, rubs, gallops or clicks. No pedal edema. Gastrointestinal system: Abdomen is nondistended, soft and nontender. No organomegaly or masses felt. Normal bowel sounds heard. Central nervous system: Alert and oriented. No focal neurological deficits. Extremities: Anatomical deformities to bilateral feet all right worse than left, right foot having external deviation Skin: No  rashes, lesions or ulcers Psychiatry: Mildly confused     Data Reviewed: I have personally reviewed following labs and imaging studies  CBC:  Recent Labs Lab 02/14/16 0939 02/15/16 0542  WBC 12.1* 9.2  NEUTROABS 9.0*  --   HGB 12.5 12.0  HCT 37.9 37.2  MCV 84.4 85.1  PLT 311 267   Basic Metabolic Panel:  Recent Labs Lab 02/14/16 0939 02/15/16 0542  NA 130* 131*  K 3.5 3.0*  CL 90* 90*  CO2 29 30  GLUCOSE 88 84  BUN 14 11  CREATININE 0.88 0.65  CALCIUM 9.4 8.7*   GFR: Estimated Creatinine Clearance: 49.2 mL/min (by C-G formula based on SCr of 0.65 mg/dL). Liver Function Tests:  Recent Labs Lab 02/15/16 0542  AST 29  ALT 15  ALKPHOS 101  BILITOT 0.9  PROT 6.2*  ALBUMIN 3.5   No results for input(s): LIPASE, AMYLASE in the last 168 hours. No results for input(s): AMMONIA in the last 168 hours. Coagulation Profile: No results for input(s): INR, PROTIME in the last 168 hours. Cardiac Enzymes: No results for input(s): CKTOTAL, CKMB, CKMBINDEX, TROPONINI in the last 168 hours. BNP (last 3 results) No results for input(s): PROBNP in the last 8760 hours. HbA1C: No results for input(s): HGBA1C in the last 72 hours. CBG: No results for input(s): GLUCAP in the last 168 hours. Lipid Profile: No results for input(s): CHOL, HDL, LDLCALC, TRIG, CHOLHDL, LDLDIRECT in the last 72 hours. Thyroid Function Tests: No results for input(s): TSH, T4TOTAL, FREET4, T3FREE, THYROIDAB in the last 72 hours. Anemia Panel: No results for input(s): VITAMINB12, FOLATE, FERRITIN, TIBC, IRON, RETICCTPCT in the last 72 hours. Sepsis Labs:  Recent Labs Lab 02/14/16 1455  LATICACIDVEN 0.91    Recent Results (from the past 240 hour(s))  Urine culture     Status: None   Collection Time: 02/14/16  9:41 AM  Result Value Ref Range Status   Specimen Description URINE, CATHETERIZED  Final   Special Requests NONE  Final   Culture NO GROWTH Performed at [facility]   Final     Report Status 02/15/2016 FINAL  Final  Culture, blood (routine x 2)     Status: None (Preliminary result)   Collection Time: 02/14/16  1:52 PM  Result Value Ref Range Status   Specimen Description BLOOD LEFT HAND  Final   Special Requests BOTTLES DRAWN AEROBIC AND ANAEROBIC 7CC EACH  Final   Culture NO GROWTH < 24 HOURS  Final   Report Status PENDING  Incomplete  Culture, blood (routine x 2)     Status: None (Preliminary result)   Collection Time: 02/14/16  2:04 PM  Result Value Ref Range Status   Specimen Description BLOOD RIGHT HAND  Final   Special Requests BOTTLES DRAWN AEROBIC ONLY 4CC  Final   Culture NO GROWTH < 24 HOURS  Final   Report Status PENDING  Incomplete  MRSA PCR Screening     Status: None  Collection Time: 02/14/16  6:00 PM  Result Value Ref Range Status   MRSA by PCR NEGATIVE NEGATIVE Final    Comment:        The GeneXpert MRSA Assay (FDA approved for NASAL specimens only), is one component of a comprehensive MRSA colonization surveillance program. It is not intended to diagnose MRSA infection nor to guide or monitor treatment for MRSA infections.          Radiology Studies: Dg Chest 2 View  Result Date: 02/14/2016 CLINICAL DATA:  Altered mental status, history of hypertension, hyperlipidemia. EXAM: CHEST  2 VIEW COMPARISON:  Portable chest x-ray of January 03, 2016 FINDINGS: The lungs are reasonably well inflated. The right hemidiaphragm is higher than the left. There is density in the posterior aspect of the right hemi thorax that suggest pleural fluid. Infiltrate here is not excluded. The cardiac silhouette is mildly enlarged though stable. The central pulmonary vascularity is prominent. No cephalization of the vascular pattern is observed. There is dense calcification in the wall of the thoracic aorta. There degenerative changes of both shoulders. There is multilevel degenerative disc disease of the thoracic spine. IMPRESSION: Chronic bronchitic  changes. Right lower lobe atelectasis or pneumonia with small right pleural effusion. Followup PA and lateral chest X-ray is recommended in 3-4 weeks following trial of antibiotic therapy to ensure resolution and exclude underlying malignancy. Cardiomegaly without definite pulmonary edema. Aortic atherosclerosis. Electronically Signed   By: David  [place] M.D.   On: 02/14/2016 10:40   Ct Head Wo Contrast  Result Date: 02/14/2016 CLINICAL DATA:  Altered mental status for 1 day, worse today EXAM: CT HEAD WITHOUT CONTRAST TECHNIQUE: Contiguous axial images were obtained from the base of the skull through the vertex without intravenous contrast. COMPARISON:  None. FINDINGS: Brain: The ventricular system is prominent as are the cortical sulci, indicative of diffuse atrophy. Moderate small vessel ischemic change is noted throughout the periventricular white matter. No hemorrhage, mass lesion, or acute infarction is seen. Vascular: On this unenhanced study, no vascular abnormality is seen. Skull: No calvarial abnormality is noted. Sinuses/Orbits: Paranasal sinuses are pneumatized. Other: None IMPRESSION: Atrophy and moderate small vessel ischemic change. No acute intracranial abnormality. Electronically Signed   By: Dwyane Dee M.D.   On: 02/14/2016 11:25        Scheduled Meds: . azithromycin  500 mg Intravenous Q24H  . busPIRone  7.5 mg Oral TID  . cefTRIAXone (ROCEPHIN)  IV  1 g Intravenous Q24H  . enoxaparin (LOVENOX) injection  40 mg Subcutaneous Q24H  . Influenza vac split quadrivalent PF  0.5 mL Intramuscular Tomorrow-1000  . levothyroxine  25 mcg Oral QAC breakfast  . potassium chloride  40 mEq Oral Once  . sertraline  25 mg Oral Daily  . traZODone  50 mg Oral QHS   Continuous Infusions:    LOS: 1 day    Time spent: 35 min    Jeralyn Bennett, MD [facility] Pager [phone]  If 7PM-7AM, please contact night-coverage [email or web address] Password [facility] 02/15/2016, 12:43 PM

## 2016-02-16 DIAGNOSIS — Z79899 Other long term (current) drug therapy: Secondary | ICD-10-CM

## 2016-02-16 DIAGNOSIS — R627 Adult failure to thrive: Secondary | ICD-10-CM

## 2016-02-16 LAB — CBC
HEMATOCRIT: 33.6 % — AB (ref 36.0–46.0)
HEMOGLOBIN: 11.2 g/dL — AB (ref 12.0–15.0)
MCH: 28.3 pg (ref 26.0–34.0)
MCHC: 33.3 g/dL (ref 30.0–36.0)
MCV: 84.8 fL (ref 78.0–100.0)
Platelets: 272 10*3/uL (ref 150–400)
RBC: 3.96 MIL/uL (ref 3.87–5.11)
RDW: 13.9 % (ref 11.5–15.5)
WBC: 9.4 10*3/uL (ref 4.0–10.5)

## 2016-02-16 LAB — BASIC METABOLIC PANEL
ANION GAP: 10 (ref 5–15)
BUN: 11 mg/dL (ref 6–20)
CHLORIDE: 91 mmol/L — AB (ref 101–111)
CO2: 30 mmol/L (ref 22–32)
Calcium: 8.4 mg/dL — ABNORMAL LOW (ref 8.9–10.3)
Creatinine, Ser: 0.69 mg/dL (ref 0.44–1.00)
GFR calc non Af Amer: >60 mL/min (ref >60)
GLUCOSE: 82 mg/dL (ref 65–99)
POTASSIUM: 3.3 mmol/L — AB (ref 3.5–5.1)
Sodium: 131 mmol/L — ABNORMAL LOW (ref 135–145)

## 2016-02-16 MED ORDER — POTASSIUM CHLORIDE CRYS ER 20 MEQ PO TBCR
40.0000 meq | EXTENDED_RELEASE_TABLET | Freq: Once | ORAL | Status: AC
  Administered 2016-02-16: 40 meq via ORAL

## 2016-02-16 MED ORDER — AMOXICILLIN-POT CLAVULANATE 875-125 MG PO TABS
1.0000 | ORAL_TABLET | Freq: Two times a day (BID) | ORAL | Status: DC
  Administered 2016-02-16 – 2016-02-18 (×3): 1 via ORAL
  Filled 2016-02-16 (×5): qty 1

## 2016-02-16 MED ORDER — FUROSEMIDE 20 MG PO TABS
20.0000 mg | ORAL_TABLET | Freq: Every day | ORAL | Status: DC
  Administered 2016-02-16: 20 mg via ORAL
  Filled 2016-02-16 (×2): qty 1

## 2016-02-16 NOTE — Clinical Social Work Placement (Signed)
   CLINICAL SOCIAL WORK PLACEMENT  NOTE  Date:  02/16/2016  Patient Details  Name: Kendra Campos MRN: [id number] Date of Birth: 10/08/1923  Clinical Social Work is seeking post-discharge placement for this patient at the Skilled  Nursing Facility level of care (*CSW will initial, date and re-position this form in  chart as items are completed):  Yes   Patient/family provided with [place] Clinical Social Work Department's list of facilities offering this level of care within the geographic area requested by the patient (or if unable, by the patient's family).  Yes   Patient/family informed of their freedom to choose among providers that offer the needed level of care, that participate in Medicare, Medicaid or managed care program needed by the patient, have an available bed and are willing to accept the patient.  Yes   Patient/family informed of [facility]'s ownership interest in [facility] and [facility], as well as of the fact that they are under no obligation to receive care at these facilities.  PASRR submitted to EDS on       PASRR number received on       Existing PASRR number confirmed on 02/15/16     FL2 transmitted to all facilities in geographic area requested by pt/family on 02/15/16     FL2 transmitted to all facilities within larger geographic area on       Patient informed that his/her managed care company has contracts with or will negotiate with certain facilities, including the following:        Yes   Patient/family informed of bed offers received.  Patient [facility] bed at [facility]     Physician recommends and patient chooses bed at      Patient to be transferred to [facility] on  .  Patient to be transferred to facility by       Patient family notified on   of transfer.  Name of family member notified:        PHYSICIAN       Additional Comment:    _______________________________________________ Karn CassisStultz, Hong Moring  [place], LCSW 02/16/2016, 9:25 AM [phone]

## 2016-02-16 NOTE — Progress Notes (Signed)
Patient's IV came out.  Site WNL.  Dr. Vanessa BarbaraZamora notified via text page.  Dr. Vanessa BarbaraZamora called and gave order to leave IV out for anticipated discharge tomorrow and stated he will change the antibiotics to oral.

## 2016-02-16 NOTE — Progress Notes (Signed)
Attempted to administer pt's meds whole. Pt uncooperative with taking meds or even drinking water. Very suspicious of staff. Increased confusion noted. Crushed meds and called pt's daughter, Kathie RhodesBetty, who spoke to pt on the phone and encouraged pt to take meds. Pt took meds in bite of applesauce and then spit them out on this nurse. Requested daughter to come to hospital in hopes of pt taking meds from a familiar person. Daughter agreed and is on her way.

## 2016-02-16 NOTE — Progress Notes (Signed)
PROGRESS NOTE    Hermelinda DellenFaye F Mahan  AVW:[id number] DOB: 03/10/1924 DOA: 02/14/2016 PCP: Colette RibasGOLDING, JOHN CABOT, MD   Brief Narrative:  [name] is a 82 year old female who had been a resident at an assisted facility transferred to the emergency room overnight for further workup and evaluation of functional decline. Her daughter had reported a gradual functional decline since April, however had a steep decline in the last 24 hours. She became minimally responsive at her skilled nursing facility, having minimal by mouth intake, profoundly weak and having confusion. She is on multiple psychotropic medications. According to her MAR she had been on buspirone, Norco taking 1 tablet 4 times daily, Ativan taking 0.5 mg 3 times daily, Zoloft, trazodone. Suspect that polypharmacy likely contributed to functional decline.    Assessment & Plan:   Principal Problem:   Altered mental state Active Problems:   GERD (gastroesophageal reflux disease)   Essential hypertension   Community acquired pneumonia   Lower extremity edema   Debility   1.  Functional decline/failure to thrive -I suspect that polypharmacy a major contributor to her functional decline. Reviewing her medication administration reconciliation, she had been on multiple psychotropic medications to include buspirone, Norco, Ativan, Zoloft, trazodone. Norco and Ativan were both scheduled. -Have decreased Norco to twice a day when necessary dosing and Ativan to when necessary twice a day dosing as well.  -She was assisted out of bed to chair, 2 person assist, she is markedly deconditioned -On 02/16/2016, she appears more awake alert interactive. She doesn't appear to have evidence of benzodiazepine withdrawal, will stop Ativan completely -She would benefit from physical therapy -Plan to transition her to skilled nursing facility the next 24 hours  2.  Acute encephalopathy -I suspect she had delirium secondary to psychotropic  medications -CT scan of brain cannot reveal acute intracranial abnormality -Improved on this morning's evaluation -Assisted out of bed to chair  3.  Possible pneumonia -Radiology reporting right lower lobe atelectasis versus pneumonia with small right pleural effusion -Afebrile -She was started on empiric IV antibiotic therapy with ceftriaxone and azithromycin -Plan to monitor  4.  Hypothyroidism -Continue Synthroid 25 g by mouth daily  5.  Hypokalemia -Lab work showed potassium of 3.0, will replace with 40 mEq of potassium chloride -On 02/16/2016 lab work showing potassium of 3.3, will replace with oral potassium  6. Acute on chronic systolic congestive heart failure. -Transthoracic echocardiogram performed on 02/15/2016 EF of 45-50% with diffuse hypokinesis and grade 1 diastolic dysfunction. Ejection fraction slightly reduced from previous echo in 2014 at which time she had an EF of 50-55%.  -On exam had evidence of volume overload for which she was initially given a dose of IV Lasix -We'll continue Lasix at 20 mg by mouth daily  DVT prophylaxis: Lovenox Code Status: DO NOT RESUSCITATE Family Communication: I spoke to her daughter  Disposition Plan: Anticipate transition to skilled nursing facility in the next 24 hours  Consultants:   Physical therapy   Antimicrobials:   Ceftriaxone  Azithromycin   Subjective: She is awake and alert, responding appropriately to questions  Objective: Vitals:   02/15/16 2311 02/16/16 0347 02/16/16 1128 02/16/16 1159  BP:  (!) 163/80    Pulse:  83    Resp:  (!) 21    Temp:  98 F (36.7 C)  97.7 F (36.5 C)  TempSrc:  Oral  Oral  SpO2: 95% 96% 94%   Weight:  84.5 kg (186 lb 4.6 oz)    Height:  Intake/Output Summary (Last 24 hours) at 02/16/16 1341 Last data filed at 02/16/16 0600  Gross per 24 hour  Intake              180 ml  Output                0 ml  Net              180 ml   Filed Weights   02/14/16 1737  02/15/16 0430 02/16/16 0347  Weight: 89.8 kg (197 lb 15.6 oz) 84.8 kg (186 lb 15.2 oz) 84.5 kg (186 lb 4.6 oz)    Examination:  General exam: She is awake, alert Respiratory system: Having coarse respiratory sounds Cardiovascular system: S1 & S2 heard, RRR. No JVD, murmurs, rubs, gallops or clicks. No pedal edema. Gastrointestinal system: Abdomen is nondistended, soft and nontender. No organomegaly or masses felt. Normal bowel sounds heard. Central nervous system: Alert and oriented. No focal neurological deficits. Extremities: Anatomical deformities to bilateral feet all right worse than left, right foot having external deviation Skin: No rashes, lesions or ulcers Psychiatry: Mildly confused     Data Reviewed: I have personally reviewed following labs and imaging studies  CBC:  Recent Labs Lab 02/14/16 0939 02/15/16 0542 02/16/16 0552  WBC 12.1* 9.2 9.4  NEUTROABS 9.0*  --   --   HGB 12.5 12.0 11.2*  HCT 37.9 37.2 33.6*  MCV 84.4 85.1 84.8  PLT 311 267 272   Basic Metabolic Panel:  Recent Labs Lab 02/14/16 0939 02/15/16 0542 02/16/16 0552  NA 130* 131* 131*  K 3.5 3.0* 3.3*  CL 90* 90* 91*  CO2 29 30 30   GLUCOSE 88 84 82  BUN 14 11 11   CREATININE 0.88 0.65 0.69  CALCIUM 9.4 8.7* 8.4*   GFR: Estimated Creatinine Clearance: 49.1 mL/min (by C-G formula based on SCr of 0.69 mg/dL). Liver Function Tests:  Recent Labs Lab 02/15/16 0542  AST 29  ALT 15  ALKPHOS 101  BILITOT 0.9  PROT 6.2*  ALBUMIN 3.5   No results for input(s): LIPASE, AMYLASE in the last 168 hours. No results for input(s): AMMONIA in the last 168 hours. Coagulation Profile: No results for input(s): INR, PROTIME in the last 168 hours. Cardiac Enzymes: No results for input(s): CKTOTAL, CKMB, CKMBINDEX, TROPONINI in the last 168 hours. BNP (last 3 results) No results for input(s): PROBNP in the last 8760 hours. HbA1C: No results for input(s): HGBA1C in the last 72 hours. CBG: No  results for input(s): GLUCAP in the last 168 hours. Lipid Profile: No results for input(s): CHOL, HDL, LDLCALC, TRIG, CHOLHDL, LDLDIRECT in the last 72 hours. Thyroid Function Tests: No results for input(s): TSH, T4TOTAL, FREET4, T3FREE, THYROIDAB in the last 72 hours. Anemia Panel: No results for input(s): VITAMINB12, FOLATE, FERRITIN, TIBC, IRON, RETICCTPCT in the last 72 hours. Sepsis Labs:  Recent Labs Lab 02/14/16 1455  LATICACIDVEN 0.91    Recent Results (from the past 240 hour(s))  Urine culture     Status: None   Collection Time: 02/14/16  9:41 AM  Result Value Ref Range Status   Specimen Description URINE, CATHETERIZED  Final   Special Requests NONE  Final   Culture NO GROWTH Performed at [facility]   Final   Report Status 02/15/2016 FINAL  Final  Culture, blood (routine x 2)     Status: None (Preliminary result)   Collection Time: 02/14/16  1:52 PM  Result Value Ref Range Status  Specimen Description BLOOD LEFT HAND  Final   Special Requests BOTTLES DRAWN AEROBIC AND ANAEROBIC 7CC EACH  Final   Culture NO GROWTH 2 DAYS  Final   Report Status PENDING  Incomplete  Culture, blood (routine x 2)     Status: None (Preliminary result)   Collection Time: 02/14/16  2:04 PM  Result Value Ref Range Status   Specimen Description BLOOD RIGHT HAND  Final   Special Requests BOTTLES DRAWN AEROBIC ONLY 4CC  Final   Culture NO GROWTH 2 DAYS  Final   Report Status PENDING  Incomplete  MRSA PCR Screening     Status: None   Collection Time: 02/14/16  6:00 PM  Result Value Ref Range Status   MRSA by PCR NEGATIVE NEGATIVE Final    Comment:        The GeneXpert MRSA Assay (FDA approved for NASAL specimens only), is one component of a comprehensive MRSA colonization surveillance program. It is not intended to diagnose MRSA infection nor to guide or monitor treatment for MRSA infections.          Radiology Studies: No results found.      Scheduled Meds: .  azithromycin  500 mg Intravenous Q24H  . busPIRone  7.5 mg Oral TID  . cefTRIAXone (ROCEPHIN)  IV  1 g Intravenous Q24H  . enoxaparin (LOVENOX) injection  40 mg Subcutaneous Q24H  . furosemide  20 mg Oral Daily  . levothyroxine  25 mcg Oral QAC breakfast  . sertraline  25 mg Oral Daily  . traZODone  50 mg Oral QHS   Continuous Infusions:    LOS: 2 days    Time spent: 35 min    Jeralyn Bennett, MD [facility] Pager [phone]  If 7PM-7AM, please contact night-coverage [email or web address] Password [email or web address] 02/16/2016, 1:41 PM

## 2016-02-17 LAB — BASIC METABOLIC PANEL
ANION GAP: 9 (ref 5–15)
BUN: 10 mg/dL (ref 6–20)
CHLORIDE: 92 mmol/L — AB (ref 101–111)
CO2: 30 mmol/L (ref 22–32)
Calcium: 8.4 mg/dL — ABNORMAL LOW (ref 8.9–10.3)
Creatinine, Ser: 0.67 mg/dL (ref 0.44–1.00)
GFR calc Af Amer: >60 mL/min (ref >60)
GLUCOSE: 91 mg/dL (ref 65–99)
POTASSIUM: 3.2 mmol/L — AB (ref 3.5–5.1)
Sodium: 131 mmol/L — ABNORMAL LOW (ref 135–145)

## 2016-02-17 LAB — CBC
HEMATOCRIT: 34.3 % — AB (ref 36.0–46.0)
HEMOGLOBIN: 11.2 g/dL — AB (ref 12.0–15.0)
MCH: 27.7 pg (ref 26.0–34.0)
MCHC: 32.7 g/dL (ref 30.0–36.0)
MCV: 84.9 fL (ref 78.0–100.0)
Platelets: 259 10*3/uL (ref 150–400)
RBC: 4.04 MIL/uL (ref 3.87–5.11)
RDW: 14.5 % (ref 11.5–15.5)
WBC: 9.3 10*3/uL (ref 4.0–10.5)

## 2016-02-17 MED ORDER — POTASSIUM CHLORIDE CRYS ER 20 MEQ PO TBCR
40.0000 meq | EXTENDED_RELEASE_TABLET | Freq: Once | ORAL | Status: DC
  Filled 2016-02-17: qty 2

## 2016-02-17 MED ORDER — QUETIAPINE FUMARATE 25 MG PO TABS
25.0000 mg | ORAL_TABLET | Freq: Two times a day (BID) | ORAL | Status: DC
  Administered 2016-02-17 – 2016-02-18 (×3): 25 mg via ORAL
  Filled 2016-02-17 (×3): qty 1

## 2016-02-17 NOTE — Progress Notes (Signed)
PROGRESS NOTE    PRITIKA ALVAREZ  ZOX:[id number] DOB: 07-Feb-1924 DOA: 02/14/2016 PCP: Colette Ribas, MD   Brief Narrative:  Kendra Campos is a 82 year old female who had been a resident at an assisted facility transferred to the emergency room overnight for further workup and evaluation of functional decline. Her daughter had reported a gradual functional decline since April, however had a steep decline in the last 24 hours. She became minimally responsive at her skilled nursing facility, having minimal by mouth intake, profoundly weak and having confusion. She is on multiple psychotropic medications. According to her MAR she had been on buspirone, Norco taking 1 tablet 4 times daily, Ativan taking 0.5 mg 3 times daily, Zoloft, trazodone. Suspect that polypharmacy likely contributed to functional decline.    Assessment & Plan:   Principal Problem:   Altered mental state Active Problems:   GERD (gastroesophageal reflux disease)   Essential hypertension   Community acquired pneumonia   Lower extremity edema   Debility   1.  Functional decline/failure to thrive -I suspect that polypharmacy a major contributor to her functional decline. Reviewing her medication administration reconciliation, she had been on multiple psychotropic medications to include buspirone, Norco, Ativan, Zoloft, trazodone. Norco and Ativan were both scheduled. -Have decreased Norco to twice a day when necessary dosing and Ativan to when necessary twice a day dosing as well.  -She was assisted out of bed to chair, 2 person assist, she is markedly deconditioned -On 02/16/2016, she appears more awake alert interactive. She doesn't appear to have evidence of benzodiazepine withdrawal, will stop Ativan completely -Overnight she became agitated, having psychosis and paranoia, refused to take oral medications or by mouth.  -Plan trial of Seroquel 25 mg twice a day  2.  Acute encephalopathy -I suspect she had  delirium secondary to psychotropic medications -CT scan of brain negative for acute intracranial abnormality -Overnight having worsening agitation, will schedule Seroquel  3.  Possible pneumonia -Radiology reporting right lower lobe atelectasis versus pneumonia with small right pleural effusion -Afebrile -She was started on empiric IV antibiotic therapy with ceftriaxone and azithromycin -Transitioned to Augmentin  4.  Hypothyroidism -Continue Synthroid 25 g by mouth daily  5.  Hypokalemia -Lab work showed potassium of 3.0, will replace with 40 mEq of potassium chloride -On 02/16/2016 lab work showing potassium of 3.3, will replace with oral potassium  6. Acute on chronic systolic congestive heart failure. -Transthoracic echocardiogram performed on 02/15/2016 EF of 45-50% with diffuse hypokinesis and grade 1 diastolic dysfunction. Ejection fraction slightly reduced from previous echo in 2014 at which time she had an EF of 50-55%.  -On exam had evidence of volume overload for which she was initially given a dose of IV Lasix -Will continue Lasix at 20 mg by mouth daily  DVT prophylaxis: Lovenox Code Status: DO NOT RESUSCITATE Family Communication: I spoke to her daughter  Disposition Plan: Given severe agitation will monitor for the next 24 hours, hopefully can be discharged to skilled nursing facility this weekend for rehabilitation  Consultants:   Physical therapy   Antimicrobials:   Ceftriaxone  Azithromycin   Subjective: She is agitated, paranoid, would not drink water for me asking me to leave room. Family members at bedside reporting that this is not her.  Objective: Vitals:   02/16/16 1159 02/16/16 1600 02/16/16 2054 02/17/16 0623  BP:  119/88 (!) 156/57 (!) 158/61  Pulse:  76 78 81  Resp:  18 20 18   Temp: 97.7 F (36.5 C)  99.1 F (37.3 C) 98.4 F (36.9 C)  TempSrc: Oral  Axillary Oral  SpO2:  100% 94% 92%  Weight:      Height:        Intake/Output  Summary (Last 24 hours) at 02/17/16 1603 Last data filed at 02/16/16 1900  Gross per 24 hour  Intake              240 ml  Output                0 ml  Net              240 ml   Filed Weights   02/14/16 1737 02/15/16 0430 02/16/16 0347  Weight: 89.8 kg (197 lb 15.6 oz) 84.8 kg (186 lb 15.2 oz) 84.5 kg (186 lb 4.6 oz)    Examination:  General exam: She is awake and alert however mild distress Respiratory system: Having coarse respiratory sounds Cardiovascular system: S1 & S2 heard, RRR. No JVD, murmurs, rubs, gallops or clicks. No pedal edema. Gastrointestinal system: Abdomen is nondistended, soft and nontender. No organomegaly or masses felt. Normal bowel sounds heard. Central nervous system: Alert and oriented. No focal neurological deficits. Extremities: Anatomical deformities to bilateral feet all right worse than left, right foot having external deviation Skin: No rashes, lesions or ulcers Psychiatry: She is agitated this morning, having paranoia, confusion, in mild distress    Data Reviewed: I have personally reviewed following labs and imaging studies  CBC:  Recent Labs Lab 02/14/16 0939 02/15/16 0542 02/16/16 0552 02/17/16 0607  WBC 12.1* 9.2 9.4 9.3  NEUTROABS 9.0*  --   --   --   HGB 12.5 12.0 11.2* 11.2*  HCT 37.9 37.2 33.6* 34.3*  MCV 84.4 85.1 84.8 84.9  PLT 311 267 272 259   Basic Metabolic Panel:  Recent Labs Lab 02/14/16 0939 02/15/16 0542 02/16/16 0552 02/17/16 0607  NA 130* 131* 131* 131*  K 3.5 3.0* 3.3* 3.2*  CL 90* 90* 91* 92*  CO2 29 30 30 30   GLUCOSE 88 84 82 91  BUN 14 11 11 10   CREATININE 0.88 0.65 0.69 0.67  CALCIUM 9.4 8.7* 8.4* 8.4*   GFR: Estimated Creatinine Clearance: 49.1 mL/min (by C-G formula based on SCr of 0.67 mg/dL). Liver Function Tests:  Recent Labs Lab 02/15/16 0542  AST 29  ALT 15  ALKPHOS 101  BILITOT 0.9  PROT 6.2*  ALBUMIN 3.5   No results for input(s): LIPASE, AMYLASE in the last 168 hours. No results  for input(s): AMMONIA in the last 168 hours. Coagulation Profile: No results for input(s): INR, PROTIME in the last 168 hours. Cardiac Enzymes: No results for input(s): CKTOTAL, CKMB, CKMBINDEX, TROPONINI in the last 168 hours. BNP (last 3 results) No results for input(s): PROBNP in the last 8760 hours. HbA1C: No results for input(s): HGBA1C in the last 72 hours. CBG: No results for input(s): GLUCAP in the last 168 hours. Lipid Profile: No results for input(s): CHOL, HDL, LDLCALC, TRIG, CHOLHDL, LDLDIRECT in the last 72 hours. Thyroid Function Tests: No results for input(s): TSH, T4TOTAL, FREET4, T3FREE, THYROIDAB in the last 72 hours. Anemia Panel: No results for input(s): VITAMINB12, FOLATE, FERRITIN, TIBC, IRON, RETICCTPCT in the last 72 hours. Sepsis Labs:  Recent Labs Lab 02/14/16 1455  LATICACIDVEN 0.91    Recent Results (from the past 240 hour(s))  Urine culture     Status: None   Collection Time: 02/14/16  9:41 AM  Result Value Ref Range  Status   Specimen Description URINE, CATHETERIZED  Final   Special Requests NONE  Final   Culture NO GROWTH [facility]   Final   Report Status 02/15/2016 FINAL  Final  Culture, blood (routine x 2)     Status: None (Preliminary result)   Collection Time: 02/14/16  1:52 PM  Result Value Ref Range Status   Specimen Description BLOOD LEFT HAND  Final   Special Requests BOTTLES DRAWN AEROBIC AND ANAEROBIC 7CC EACH  Final   Culture NO GROWTH 3 DAYS  Final   Report Status PENDING  Incomplete  Culture, blood (routine x 2)     Status: None (Preliminary result)   Collection Time: 02/14/16  2:04 PM  Result Value Ref Range Status   Specimen Description BLOOD RIGHT HAND  Final   Special Requests BOTTLES DRAWN AEROBIC ONLY 4CC  Final   Culture NO GROWTH 3 DAYS  Final   Report Status PENDING  Incomplete  MRSA PCR Screening     Status: None   Collection Time: 02/14/16  6:00 PM  Result Value Ref Range Status   MRSA by  PCR NEGATIVE NEGATIVE Final    Comment:        The GeneXpert MRSA Assay (FDA approved for NASAL specimens only), is one component of a comprehensive MRSA colonization surveillance program. It is not intended to diagnose MRSA infection nor to guide or monitor treatment for MRSA infections.          Radiology Studies: No results found.      Scheduled Meds: . amoxicillin-clavulanate  1 tablet Oral Q12H  . busPIRone  7.5 mg Oral TID  . enoxaparin (LOVENOX) injection  40 mg Subcutaneous Q24H  . furosemide  20 mg Oral Daily  . levothyroxine  25 mcg Oral QAC breakfast  . potassium chloride  40 mEq Oral Once  . QUEtiapine  25 mg Oral BID  . sertraline  25 mg Oral Daily   Continuous Infusions:    LOS: 3 days    Time spent: 25 min    Jeralyn BennettZAMORA, Marcellis Frampton, MD [facility] Pager [phone]  If 7PM-7AM, please contact night-coverage [email or web address] Password [email or web address] 02/17/2016, 4:03 PM

## 2016-02-17 NOTE — Progress Notes (Signed)
Patient hollering and yelling out "911", says she wants to go home, mumbles and moans loudly. Patient did take her pain pill crushed in applesauce.

## 2016-02-17 NOTE — Progress Notes (Signed)
Pt's family spent 2 hours trying to give pt her meds with no success. Pt has calmed down and is resting but still refuses meds at this time. Family requests that pt be allowed to rest and if she wakes up later in the night that nurse attempts to give meds again.

## 2016-02-17 NOTE — Clinical Social Work Note (Signed)
CSW updated PNC on pt. Aware of new confusion and agitation. Anticipate d/c this weekend. Facility agreeable.  Derenda FennelKara Ulis Kaps, LCSW [phone]

## 2016-02-17 NOTE — Progress Notes (Signed)
Pt resting comfortably in room.

## 2016-02-17 NOTE — Progress Notes (Signed)
MD informed of pt's loss of IV access. No order given to start IV access.

## 2016-02-17 NOTE — Progress Notes (Signed)
Attempted to administer pt's meds whole. Pt uncooperative with taking meds or even drinking water. Very suspicious of staff. Increased confusion noted. Crushed meds and placed in applesauce. Pt took meds in bite of applesauce and then spit them out on this nurse. MD notified and made aware.

## 2016-02-17 NOTE — Progress Notes (Signed)
Patient hollering moaning yelling to let her out, she is trying to climb out of bed. I placed the bed in Trendelenburg and placed bed alarm on and advised secretary to call family and possibly get a Comptrollersitter.

## 2016-02-17 NOTE — Progress Notes (Signed)
Seroquel 25 mg P.O. Ordered. Pt's daughter and son-in-law in room. Pt previously refused medication and son-in-law offered to give medication to patient in vanilla pudding. Pt took pill with with a lot of encouragement. Pill successfully swallowed pill.

## 2016-02-17 NOTE — Care Management Important Message (Signed)
Important Message  Patient Details  Name: Kendra Campos MRN: Hermelinda [id number] Date of Birth: 05/09/1924   Medicare Important Message Given:  Yes    Wilmer Berryhill, Chrystine OilerSharley Diane, RN 02/17/2016, 1:30 PM

## 2016-02-18 ENCOUNTER — Inpatient Hospital Stay (HOSPITAL_COMMUNITY): Payer: Medicare Other

## 2016-02-18 DIAGNOSIS — T17800A Unspecified foreign body in other parts of respiratory tract causing asphyxiation, initial encounter: Secondary | ICD-10-CM

## 2016-02-18 DIAGNOSIS — N3000 Acute cystitis without hematuria: Secondary | ICD-10-CM

## 2016-02-18 DIAGNOSIS — Z8619 Personal history of other infectious and parasitic diseases: Secondary | ICD-10-CM

## 2016-02-18 LAB — URINALYSIS, ROUTINE W REFLEX MICROSCOPIC
Bilirubin Urine: NEGATIVE
Glucose, UA: NEGATIVE mg/dL
NITRITE: NEGATIVE
PH: 7 (ref 5.0–8.0)
Specific Gravity, Urine: 1.02 (ref 1.005–1.030)

## 2016-02-18 LAB — BASIC METABOLIC PANEL
ANION GAP: 13 (ref 5–15)
BUN: 12 mg/dL (ref 6–20)
CALCIUM: 8.8 mg/dL — AB (ref 8.9–10.3)
CO2: 27 mmol/L (ref 22–32)
Chloride: 92 mmol/L — ABNORMAL LOW (ref 101–111)
Creatinine, Ser: 0.87 mg/dL (ref 0.44–1.00)
GFR, EST NON AFRICAN AMERICAN: 57 mL/min — AB (ref >60)
Glucose, Bld: 88 mg/dL (ref 65–99)
Potassium: 3.2 mmol/L — ABNORMAL LOW (ref 3.5–5.1)
Sodium: 132 mmol/L — ABNORMAL LOW (ref 135–145)

## 2016-02-18 LAB — CBC
HCT: 38.3 % (ref 36.0–46.0)
HEMOGLOBIN: 12.7 g/dL (ref 12.0–15.0)
MCH: 28 pg (ref 26.0–34.0)
MCHC: 33.2 g/dL (ref 30.0–36.0)
MCV: 84.5 fL (ref 78.0–100.0)
Platelets: 251 10*3/uL (ref 150–400)
RBC: 4.53 MIL/uL (ref 3.87–5.11)
RDW: 14.8 % (ref 11.5–15.5)
WBC: 15.2 10*3/uL — ABNORMAL HIGH (ref 4.0–10.5)

## 2016-02-18 LAB — URINE MICROSCOPIC-ADD ON

## 2016-02-18 MED ORDER — POTASSIUM CHLORIDE 10 MEQ/50ML IV SOLN
10.0000 meq | INTRAVENOUS | Status: DC
  Filled 2016-02-18 (×4): qty 50

## 2016-02-18 MED ORDER — SODIUM CHLORIDE 0.9 % IV BOLUS (SEPSIS)
500.0000 mL | Freq: Once | INTRAVENOUS | Status: AC
  Administered 2016-02-18: 500 mL via INTRAVENOUS

## 2016-02-18 MED ORDER — SODIUM CHLORIDE 0.9 % IV SOLN
INTRAVENOUS | Status: DC
  Administered 2016-02-18: 500 mL via INTRAVENOUS
  Administered 2016-02-18: 21:00:00 via INTRAVENOUS

## 2016-02-18 MED ORDER — QUETIAPINE FUMARATE 25 MG PO TABS: 12.5000 mg | ORAL_TABLET | Freq: Two times a day (BID) | ORAL | Status: DC

## 2016-02-18 MED ORDER — QUETIAPINE FUMARATE 25 MG PO TABS: 12.5000 mg | ORAL_TABLET | Freq: Two times a day (BID) | ORAL | Status: DC | PRN

## 2016-02-18 MED ORDER — POTASSIUM CHLORIDE 10 MEQ/100ML IV SOLN
10.0000 meq | INTRAVENOUS | Status: AC
  Administered 2016-02-18 (×4): 10 meq via INTRAVENOUS
  Filled 2016-02-18 (×2): qty 100

## 2016-02-18 MED ORDER — ACETAMINOPHEN 325 MG PO TABS: 650.0000 mg | ORAL_TABLET | Freq: Four times a day (QID) | ORAL | Status: DC | PRN

## 2016-02-18 MED ORDER — PIPERACILLIN-TAZOBACTAM 3.375 G IVPB
3.3750 g | Freq: Three times a day (TID) | INTRAVENOUS | Status: DC
  Administered 2016-02-18 – 2016-02-24 (×18): 3.375 g via INTRAVENOUS
  Filled 2016-02-18 (×17): qty 50

## 2016-02-18 NOTE — Progress Notes (Signed)
PROGRESS NOTE    KIRAH STICE  ZOX:[id number] DOB: 03-13-1924 DOA: 02/14/2016 PCP: Colette Ribas, MD   Brief Narrative:  Kendra Campos is a 82 year old female who had been a resident at an assisted facility transferred to the emergency room overnight for further workup and evaluation of functional decline. Her daughter had reported a gradual functional decline since April, however had a steep decline in the last 24 hours. She became minimally responsive at her skilled nursing facility, having minimal by mouth intake, profoundly weak and having confusion. She is on multiple psychotropic medications. According to her MAR she had been on buspirone, Norco taking 1 tablet 4 times daily, Ativan taking 0.5 mg 3 times daily, Zoloft, trazodone. Suspect that polypharmacy likely contributed to functional decline.    Assessment & Plan:   Principal Problem:   Altered mental state Active Problems:   GERD (gastroesophageal reflux disease)   Essential hypertension   Community acquired pneumonia   Lower extremity edema   Debility   1.  Functional decline/failure to thrive -I suspect that polypharmacy a major contributor to her functional decline. Reviewing her medication administration reconciliation, she had been on multiple psychotropic medications to include buspirone, Norco, Ativan, Zoloft, trazodone. Norco and Ativan were both scheduled. -Have decreased Norco to twice a day when necessary dosing and Ativan to when necessary twice a day dosing as well.  -She was assisted out of bed to chair, 2 person assist, she is markedly deconditioned -On 02/16/2016, she appears more awake alert interactive. She doesn't appear to have evidence of benzodiazepine withdrawal, will stop Ativan completely -On 02/17/2016: Overnight she became agitated, having psychosis and paranoia, refused to take oral medications or by mouth, trial of Seroquel 25 mg twice a day -02/18/2016 will decrease her dose to 12.5 mg  by mouth twice a day as needed for agitation  2.  Aspiration pneumonia -On 02/18/2016 she spiked a fever of 100.7 as lab work revealed an elevated white count of 15,200, increased from 9,300 -She had an episode of severe agitation as I suspect she aspirated during that spell. Family members reporting that the evening of severe agitation they were having difficulties getting her to take her PO meds.  -Further workup today with chest x-ray showing new right lower lobe infiltrate -Will place her on IV Zosyn  3.  Urinary tract infection -As mentioned above she spiked a fever of 100.7 this morning with increase in white count to 15,200. Workup included a urinalysis that was found to be cloudy and microscopic analyses showing many bacteria and leukocytes. Significant change from previous UA on 02/14/2016. -She has a history of ESBL, with a urine culture obtained on 01/03/2016 growing Klebsiella. This organism was sensitive to ciprofloxacin, imipenem and Zosyn, resistant to, ceftriaxone, gentamicin, Bactrim. -Have started IV Zosyn which would cover this organism along with aspiration pneumonia.  4.  Acute encephalopathy -I suspect she had delirium secondary to psychotropic medications -CT scan of brain negative for acute intracranial abnormality -Agitation likely resulting from multiple factors including hospitalization, polypharmacy, UTI, aspiration pneumonia  5.  Hypothyroidism -Continue Synthroid 25 g by mouth daily  6.  Hypokalemia -Lab work showed potassium of 3.0, will replace with 40 mEq of potassium chloride -On 02/16/2016 lab work showing potassium of 3.3, will replace with oral potassium -On 02/18/2016 potassium remains low at 3.2, will provide IV potassium replacement  7. Acute on chronic systolic congestive heart failure. -Transthoracic echocardiogram performed on 02/15/2016 EF of 45-50% with diffuse hypokinesis and grade 1  diastolic dysfunction. Ejection fraction slightly reduced  from previous echo in 2014 at which time she had an EF of 50-55%.  -On exam had evidence of volume overload for which she was initially given a dose of IV Lasix -Will continue Lasix at 20 mg by mouth daily -On the 02/18/2016 she has had little by mouth intake due to severe agitation, delirium, I'm concerned about dehydration. Have stopped Lasix and provided gentle IV fluid hydration.  DVT prophylaxis: Lovenox Code Status: DO NOT RESUSCITATE Family Communication: I spoke to her daughter  Disposition Plan: Given severe agitation will monitor for the next 24 hours, hopefully can be discharged to skilled nursing facility this weekend for rehabilitation  Consultants:   Physical therapy   Antimicrobials:   Ceftriaxone  Azithromycin   Subjective: Today she is less agitated however sleepy, confused, not at her baseline  Objective: Vitals:   02/17/16 1816 02/17/16 2100 02/18/16 0600 02/18/16 0812  BP: 131/61  (!) 168/67   Pulse: 90 96 (!) 108   Resp: 18 16 20    Temp: 98.2 F (36.8 C) 100 F (37.8 C) (!) 100.7 F (38.2 C) 98.9 F (37.2 C)  TempSrc:  Oral Oral Oral  SpO2: 94%  94%   Weight:      Height:        Intake/Output Summary (Last 24 hours) at 02/18/16 1335 Last data filed at 02/18/16 0700  Gross per 24 hour  Intake              120 ml  Output                2 ml  Net              118 ml   Filed Weights   02/14/16 1737 02/15/16 0430 02/16/16 0347  Weight: 89.8 kg (197 lb 15.6 oz) 84.8 kg (186 lb 15.2 oz) 84.5 kg (186 lb 4.6 oz)    Examination:  General exam: She is sleepy, arousable, ill-appearing Respiratory system: Having coarse respiratory sounds Cardiovascular system: S1 & S2 heard, RRR. No JVD, murmurs, rubs, gallops or clicks. No pedal edema. Gastrointestinal system: Abdomen is nondistended, soft and nontender. No organomegaly or masses felt. Normal bowel sounds heard. Central nervous system: Alert and oriented. No focal neurological  deficits. Extremities: Anatomical deformities to bilateral feet all right worse than left, right foot having external deviation Skin: No rashes, lesions or ulcers Psychiatry: Lethargic    Data Reviewed: I have personally reviewed following labs and imaging studies  CBC:  Recent Labs Lab 02/14/16 0939 02/15/16 0542 02/16/16 0552 02/17/16 0607 02/18/16 0844  WBC 12.1* 9.2 9.4 9.3 15.2*  NEUTROABS 9.0*  --   --   --   --   HGB 12.5 12.0 11.2* 11.2* 12.7  HCT 37.9 37.2 33.6* 34.3* 38.3  MCV 84.4 85.1 84.8 84.9 84.5  PLT 311 267 272 259 251   Basic Metabolic Panel:  Recent Labs Lab 02/14/16 0939 02/15/16 0542 02/16/16 0552 02/17/16 0607 02/18/16 0844  NA 130* 131* 131* 131* 132*  K 3.5 3.0* 3.3* 3.2* 3.2*  CL 90* 90* 91* 92* 92*  CO2 29 30 30 30 27   GLUCOSE 88 84 82 91 88  BUN 14 11 11 10 12   CREATININE 0.88 0.65 0.69 0.67 0.87  CALCIUM 9.4 8.7* 8.4* 8.4* 8.8*   GFR: Estimated Creatinine Clearance: 45.2 mL/min (by C-G formula based on SCr of 0.87 mg/dL). Liver Function Tests:  Recent Labs Lab 02/15/16 0542  AST  29  ALT 15  ALKPHOS 101  BILITOT 0.9  PROT 6.2*  ALBUMIN 3.5   No results for input(s): LIPASE, AMYLASE in the last 168 hours. No results for input(s): AMMONIA in the last 168 hours. Coagulation Profile: No results for input(s): INR, PROTIME in the last 168 hours. Cardiac Enzymes: No results for input(s): CKTOTAL, CKMB, CKMBINDEX, TROPONINI in the last 168 hours. BNP (last 3 results) No results for input(s): PROBNP in the last 8760 hours. HbA1C: No results for input(s): HGBA1C in the last 72 hours. CBG: No results for input(s): GLUCAP in the last 168 hours. Lipid Profile: No results for input(s): CHOL, HDL, LDLCALC, TRIG, CHOLHDL, LDLDIRECT in the last 72 hours. Thyroid Function Tests: No results for input(s): TSH, T4TOTAL, FREET4, T3FREE, THYROIDAB in the last 72 hours. Anemia Panel: No results for input(s): VITAMINB12, FOLATE, FERRITIN,  TIBC, IRON, RETICCTPCT in the last 72 hours. Sepsis Labs:  Recent Labs Lab 02/14/16 1455  LATICACIDVEN 0.91    Recent Results (from the past 240 hour(s))  Urine culture     Status: None   Collection Time: 02/14/16  9:41 AM  Result Value Ref Range Status   Specimen Description URINE, CATHETERIZED  Final   Special Requests NONE  Final   Culture NO GROWTH Performed at [facility]   Final   Report Status 02/15/2016 FINAL  Final  Culture, blood (routine x 2)     Status: None (Preliminary result)   Collection Time: 02/14/16  1:52 PM  Result Value Ref Range Status   Specimen Description BLOOD LEFT HAND  Final   Special Requests BOTTLES DRAWN AEROBIC AND ANAEROBIC 7CC EACH  Final   Culture NO GROWTH 3 DAYS  Final   Report Status PENDING  Incomplete  Culture, blood (routine x 2)     Status: None (Preliminary result)   Collection Time: 02/14/16  2:04 PM  Result Value Ref Range Status   Specimen Description BLOOD RIGHT HAND  Final   Special Requests BOTTLES DRAWN AEROBIC ONLY 4CC  Final   Culture NO GROWTH 3 DAYS  Final   Report Status PENDING  Incomplete  MRSA PCR Screening     Status: None   Collection Time: 02/14/16  6:00 PM  Result Value Ref Range Status   MRSA by PCR NEGATIVE NEGATIVE Final    Comment:        The GeneXpert MRSA Assay (FDA approved for NASAL specimens only), is one component of a comprehensive MRSA colonization surveillance program. It is not intended to diagnose MRSA infection nor to guide or monitor treatment for MRSA infections.          Radiology Studies: Dg Chest 1 View  Result Date: 02/18/2016 CLINICAL DATA:  Fever- possible aspiration, hx of recent UTI EXAM: CHEST 1 VIEW COMPARISON:  Radiograph 02/14/2016 FINDINGS: Stable cardiac silhouette. There is new RIGHT effusion with potential RIGHT lower lobe airspace disease. Upper lungs are clear. IMPRESSION: New RIGHT effusion with potential aspiration or pneumonia in the RIGHT lower lobe.  Electronically Signed   By: Genevive Bi M.D.   On: 02/18/2016 13:26        Scheduled Meds: . busPIRone  7.5 mg Oral TID  . enoxaparin (LOVENOX) injection  40 mg Subcutaneous Q24H  . levothyroxine  25 mcg Oral QAC breakfast  . piperacillin-tazobactam (ZOSYN)  IV  3.375 g Intravenous Q8H  . potassium chloride  10 mEq Intravenous Q1 Hr x 4  . potassium chloride  40 mEq Oral Once  .  sertraline  25 mg Oral Daily  . sodium chloride  500 mL Intravenous Once   Continuous Infusions: . sodium chloride       LOS: 4 days    Time spent: 35 min    Jeralyn BennettZAMORA, Hazelene Doten, MD [facility] Pager [phone]  If 7PM-7AM, please contact night-coverage [email or web address] Password [email or web address] 02/18/2016, 1:35 PM

## 2016-02-18 NOTE — Progress Notes (Signed)
On arrival to the room, patients right arm found to be swollen with bruising  to the upper and posterior areas of arm. Kendra Campos grimacing when passive movement is attempted. RN at this time informed Dr. Nedra HaiLee of concerns via Ami on. Return call received from Dr. With acknowledgement of finding. No new orders given or initiated at this time. I will continue to monitor right arm during the shift

## 2016-02-19 DIAGNOSIS — T17998A Other foreign object in respiratory tract, part unspecified causing other injury, initial encounter: Secondary | ICD-10-CM

## 2016-02-19 LAB — BASIC METABOLIC PANEL
Anion gap: 12 (ref 5–15)
BUN: 17 mg/dL (ref 6–20)
CHLORIDE: 95 mmol/L — AB (ref 101–111)
CO2: 26 mmol/L (ref 22–32)
Calcium: 8.4 mg/dL — ABNORMAL LOW (ref 8.9–10.3)
Creatinine, Ser: 0.89 mg/dL (ref 0.44–1.00)
GFR calc Af Amer: >60 mL/min (ref >60)
GFR calc non Af Amer: 55 mL/min — ABNORMAL LOW (ref >60)
GLUCOSE: 93 mg/dL (ref 65–99)
POTASSIUM: 3.5 mmol/L (ref 3.5–5.1)
Sodium: 133 mmol/L — ABNORMAL LOW (ref 135–145)

## 2016-02-19 LAB — CBC
HEMATOCRIT: 33.6 % — AB (ref 36.0–46.0)
HEMOGLOBIN: 11.1 g/dL — AB (ref 12.0–15.0)
MCH: 28.2 pg (ref 26.0–34.0)
MCHC: 33 g/dL (ref 30.0–36.0)
MCV: 85.5 fL (ref 78.0–100.0)
Platelets: 220 10*3/uL (ref 150–400)
RBC: 3.93 MIL/uL (ref 3.87–5.11)
RDW: 14.9 % (ref 11.5–15.5)
WBC: 13.2 10*3/uL — AB (ref 4.0–10.5)

## 2016-02-19 MED ORDER — OXYCODONE HCL 5 MG PO TABS
5.0000 mg | ORAL_TABLET | Freq: Three times a day (TID) | ORAL | Status: DC | PRN
  Administered 2016-02-20 – 2016-02-21 (×2): 5 mg via ORAL
  Filled 2016-02-19 (×2): qty 1

## 2016-02-19 MED ORDER — ACETAMINOPHEN 500 MG PO TABS
1000.0000 mg | ORAL_TABLET | Freq: Three times a day (TID) | ORAL | Status: DC
  Administered 2016-02-19 – 2016-02-22 (×8): 1000 mg via ORAL
  Filled 2016-02-19 (×9): qty 2

## 2016-02-19 NOTE — Progress Notes (Signed)
Addendum  /correction  to [id number] note written by RN. Return call and as listed in previous note was not in fact Dr Nedra HaiLee but Dr Selena BattenKim.

## 2016-02-19 NOTE — Progress Notes (Signed)
PROGRESS NOTE    Kendra Campos  HQI:[id number] DOB: Sep 24, 1923 DOA: 02/14/2016 PCP: Colette Ribas, MD   Brief Narrative:  Kendra Campos is a 82 year old female who had been a resident at an assisted facility transferred to the emergency room overnight for further workup and evaluation of functional decline. Her daughter had reported a gradual functional decline since April, however had a steep decline in the last 24 hours. She became minimally responsive at her skilled nursing facility, having minimal by mouth intake, profoundly weak and having confusion. She is on multiple psychotropic medications. According to her MAR she had been on buspirone, Norco taking 1 tablet 4 times daily, Ativan taking 0.5 mg 3 times daily, Zoloft, trazodone. Suspect that polypharmacy likely contributed to functional decline.    Assessment & Plan:   Principal Problem:   Altered mental state Active Problems:   GERD (gastroesophageal reflux disease)   Essential hypertension   Community acquired pneumonia   Lower extremity edema   Debility   1.  Functional decline/failure to thrive -I suspect that polypharmacy a major contributor to her functional decline. Reviewing her medication administration reconciliation, she had been on multiple psychotropic medications to include buspirone, Norco, Ativan, Zoloft, trazodone. Norco and Ativan were both scheduled. -Have decreased Norco to twice a day when necessary dosing and Ativan to when necessary twice a day dosing as well.  -She was assisted out of bed to chair, 2 person assist, she is markedly deconditioned -On 02/16/2016, she appears more awake alert interactive. She doesn't appear to have evidence of benzodiazepine withdrawal, will stop Ativan completely -On 02/17/2016: Overnight she became agitated, having psychosis and paranoia, refused to take oral medications or by mouth, trial of Seroquel 25 mg twice a day -02/18/2016 will decrease her dose to 12.5 mg  by mouth twice a day as needed for agitation -02/19/2016. On this date patient showing clinical improvement, she was less agitated, was sat up for breakfast. Remains profoundly deconditioned, will try to get her out of bed today.   2.  Aspiration pneumonia -On 02/18/2016 she spiked a fever of 100.7 as lab work revealed an elevated white count of 15,200, increased from 9,300 -She had an episode of severe agitation as I suspect she aspirated during that spell. Family members reporting that the evening of severe agitation they were having difficulties getting her to take her PO meds.  -Further workup today with chest x-ray showing new right lower lobe infiltrate -Will place her on IV Zosyn -On 02/19/2016, has remained afebrile overnight, showing overall improvement. I helped her with her breakfast, did not show signs of aspiration  3.  Urinary tract infection -As mentioned above she spiked a fever of 100.7 this morning with increase in white count to 15,200. Workup included a urinalysis that was found to be cloudy and microscopic analyses showing many bacteria and leukocytes. Significant change from previous UA on 02/14/2016. -She has a history of ESBL, with a urine culture obtained on 01/03/2016 growing Klebsiella. This organism was sensitive to ciprofloxacin, imipenem and Zosyn, resistant to, ceftriaxone, gentamicin, Bactrim. -Have started IV Zosyn which would cover this organism along with aspiration pneumonia. -Urine cultures are pending  4.  Acute encephalopathy -I suspect she had delirium secondary to psychotropic medications -CT scan of brain negative for acute intracranial abnormality -Agitation likely resulting from multiple factors including hospitalization, polypharmacy, UTI, aspiration pneumonia  5.  Hypothyroidism -Continue Synthroid 25 g by mouth daily  6.  Hypokalemia -Lab work showed potassium of 3.0, will  replace with 40 mEq of potassium chloride -On 02/16/2016 lab work  showing potassium of 3.3, will replace with oral potassium -On 02/18/2016 potassium remains low at 3.2, will provide IV potassium replacement  7. Acute on chronic systolic congestive heart failure. -Transthoracic echocardiogram performed on 02/15/2016 EF of 45-50% with diffuse hypokinesis and grade 1 diastolic dysfunction. Ejection fraction slightly reduced from previous echo in 2014 at which time she had an EF of 50-55%.  -On exam had evidence of volume overload for which she was initially given a dose of IV Lasix -Will continue Lasix at 20 mg by mouth daily -On the 02/18/2016 she has had little by mouth intake due to severe agitation, delirium, I'm concerned about dehydration. Have stopped Lasix and provided gentle IV fluid hydration.  DVT prophylaxis: Lovenox Code Status: DO NOT RESUSCITATE Family Communication: I spoke to her daughter  Disposition Plan: Given severe agitation will monitor for the next 24 hours, hopefully can be discharged to skilled nursing facility this weekend for rehabilitation  Consultants:   Physical therapy   Antimicrobials:   Ceftriaxone  Azithromycin   Subjective: Looks better today, she is awake, alert, interactive seems a little more coherent, set up for breakfast  Objective: Vitals:   02/18/16 0812 02/18/16 1715 02/18/16 2057 02/19/16 0500  BP:  (!) 132/55 (!) 157/51 (!) 141/77  Pulse:  89 78 79  Resp:  16 18 18   Temp: 98.9 F (37.2 C) 98.3 F (36.8 C) 97.7 F (36.5 C) 98.7 F (37.1 C)  TempSrc: Oral Oral Axillary Axillary  SpO2:  93% 94% 93%  Weight:    84.1 kg (185 lb 6.5 oz)  Height:        Intake/Output Summary (Last 24 hours) at 02/19/16 1043 Last data filed at 02/18/16 2111  Gross per 24 hour  Intake             1170 ml  Output                1 ml  Net             1169 ml   Filed Weights   02/15/16 0430 02/16/16 0347 02/19/16 0500  Weight: 84.8 kg (186 lb 15.2 oz) 84.5 kg (186 lb 4.6 oz) 84.1 kg (185 lb 6.5 oz)     Examination:  General exam: She is awake, alert, interactive, overall looks better  Respiratory system: Improved lung exam, normal respiratory effort Cardiovascular system: S1 & S2 heard, RRR. No JVD, murmurs, rubs, gallops or clicks. No pedal edema. Gastrointestinal system: Abdomen is nondistended, soft and nontender. No organomegaly or masses felt. Normal bowel sounds heard. Central nervous system: Alert and oriented. No focal neurological deficits. Extremities: Anatomical deformities to bilateral feet all right worse than left, right foot having external deviation Skin: No rashes, lesions or ulcers Psychiatry: Lethargic    Data Reviewed: I have personally reviewed following labs and imaging studies  CBC:  Recent Labs Lab 02/14/16 0939 02/15/16 0542 02/16/16 0552 02/17/16 0607 02/18/16 0844 02/19/16 0718  WBC 12.1* 9.2 9.4 9.3 15.2* 13.2*  NEUTROABS 9.0*  --   --   --   --   --   HGB 12.5 12.0 11.2* 11.2* 12.7 11.1*  HCT 37.9 37.2 33.6* 34.3* 38.3 33.6*  MCV 84.4 85.1 84.8 84.9 84.5 85.5  PLT 311 267 272 259 251 220   Basic Metabolic Panel:  Recent Labs Lab 02/15/16 0542 02/16/16 0552 02/17/16 0607 02/18/16 0844 02/19/16 0718  NA 131* 131* 131*  132* 133*  K 3.0* 3.3* 3.2* 3.2* 3.5  CL 90* 91* 92* 92* 95*  CO2 30 30 30 27 26   GLUCOSE 84 82 91 88 93  BUN 11 11 10 12 17   CREATININE 0.65 0.69 0.67 0.87 0.89  CALCIUM 8.7* 8.4* 8.4* 8.8* 8.4*   GFR: Estimated Creatinine Clearance: 44.1 mL/min (by C-G formula based on SCr of 0.89 mg/dL). Liver Function Tests:  Recent Labs Lab 02/15/16 0542  AST 29  ALT 15  ALKPHOS 101  BILITOT 0.9  PROT 6.2*  ALBUMIN 3.5   No results for input(s): LIPASE, AMYLASE in the last 168 hours. No results for input(s): AMMONIA in the last 168 hours. Coagulation Profile: No results for input(s): INR, PROTIME in the last 168 hours. Cardiac Enzymes: No results for input(s): CKTOTAL, CKMB, CKMBINDEX, TROPONINI in the last 168  hours. BNP (last 3 results) No results for input(s): PROBNP in the last 8760 hours. HbA1C: No results for input(s): HGBA1C in the last 72 hours. CBG: No results for input(s): GLUCAP in the last 168 hours. Lipid Profile: No results for input(s): CHOL, HDL, LDLCALC, TRIG, CHOLHDL, LDLDIRECT in the last 72 hours. Thyroid Function Tests: No results for input(s): TSH, T4TOTAL, FREET4, T3FREE, THYROIDAB in the last 72 hours. Anemia Panel: No results for input(s): VITAMINB12, FOLATE, FERRITIN, TIBC, IRON, RETICCTPCT in the last 72 hours. Sepsis Labs:  Recent Labs Lab 02/14/16 1455  LATICACIDVEN 0.91    Recent Results (from the past 240 hour(s))  Urine culture     Status: None   Collection Time: 02/14/16  9:41 AM  Result Value Ref Range Status   Specimen Description URINE, CATHETERIZED  Final   Special Requests NONE  Final   Culture NO GROWTH Performed at [facility]   Final   Report Status 02/15/2016 FINAL  Final  Culture, blood (routine x 2)     Status: None (Preliminary result)   Collection Time: 02/14/16  1:52 PM  Result Value Ref Range Status   Specimen Description BLOOD LEFT HAND  Final   Special Requests BOTTLES DRAWN AEROBIC AND ANAEROBIC 7CC EACH  Final   Culture NO GROWTH 3 DAYS  Final   Report Status PENDING  Incomplete  Culture, blood (routine x 2)     Status: None (Preliminary result)   Collection Time: 02/14/16  2:04 PM  Result Value Ref Range Status   Specimen Description BLOOD RIGHT HAND  Final   Special Requests BOTTLES DRAWN AEROBIC ONLY 4CC  Final   Culture NO GROWTH 3 DAYS  Final   Report Status PENDING  Incomplete  MRSA PCR Screening     Status: None   Collection Time: 02/14/16  6:00 PM  Result Value Ref Range Status   MRSA by PCR NEGATIVE NEGATIVE Final    Comment:        The GeneXpert MRSA Assay (FDA approved for NASAL specimens only), is one component of a comprehensive MRSA colonization surveillance program. It is not intended to  diagnose MRSA infection nor to guide or monitor treatment for MRSA infections.          Radiology Studies: Dg Chest 1 View  Result Date: 02/18/2016 CLINICAL DATA:  Fever- possible aspiration, hx of recent UTI EXAM: CHEST 1 VIEW COMPARISON:  Radiograph 02/14/2016 FINDINGS: Stable cardiac silhouette. There is new RIGHT effusion with potential RIGHT lower lobe airspace disease. Upper lungs are clear. IMPRESSION: New RIGHT effusion with potential aspiration or pneumonia in the RIGHT lower lobe. Electronically Signed  By: Genevive BiStewart  Edmunds M.D.   On: 02/18/2016 13:26        Scheduled Meds: . acetaminophen  1,000 mg Oral TID  . busPIRone  7.5 mg Oral TID  . enoxaparin (LOVENOX) injection  40 mg Subcutaneous Q24H  . levothyroxine  25 mcg Oral QAC breakfast  . piperacillin-tazobactam (ZOSYN)  IV  3.375 g Intravenous Q8H  . potassium chloride  40 mEq Oral Once  . sertraline  25 mg Oral Daily   Continuous Infusions: . sodium chloride 75 mL/hr at 02/18/16 2108     LOS: 5 days    Time spent: 35 min    Jeralyn BennettZAMORA, Karalyne Nusser, MD [facility] Pager [phone]  If 7PM-7AM, please contact night-coverage [email or web address] Password [email or web address] 02/19/2016, 10:43 AM

## 2016-02-19 NOTE — Clinical Social Work Note (Signed)
Clinical Social Worker continuing to follow patient and family for support and discharge planning needs.  Patient with severe agitation and per MD note, questionable dehydration due to poor PO intake.  CSW remains available for support and to facilitate patient discharge needs to [facility] once medically stable.  Macario GoldsJesse Zayde Stroupe, [place] [phone]

## 2016-02-19 NOTE — Progress Notes (Signed)
Spoke with Dr Nedra HaiLee about continued concerns pt patents right arm pain and swelling. Dr Nedra Hailee came directly to the floor and examined patients rt arm extensively. Passive ROM performed. During exam while ROM exercises were performed, patient was engaged in conversation with physician. No grimacing or other visible signs of pain was noted. I will continue to monitor arm and relay concerns and findings to day shift nurse this AM should any f/u be needed with AM physician

## 2016-02-20 DIAGNOSIS — A499 Bacterial infection, unspecified: Secondary | ICD-10-CM

## 2016-02-20 DIAGNOSIS — Z1612 Extended spectrum beta lactamase (ESBL) resistance: Secondary | ICD-10-CM

## 2016-02-20 LAB — BASIC METABOLIC PANEL
ANION GAP: 8 (ref 5–15)
BUN: 22 mg/dL — ABNORMAL HIGH (ref 6–20)
CALCIUM: 8.3 mg/dL — AB (ref 8.9–10.3)
CO2: 27 mmol/L (ref 22–32)
Chloride: 99 mmol/L — ABNORMAL LOW (ref 101–111)
Creatinine, Ser: 0.82 mg/dL (ref 0.44–1.00)
Glucose, Bld: 88 mg/dL (ref 65–99)
POTASSIUM: 3.3 mmol/L — AB (ref 3.5–5.1)
SODIUM: 134 mmol/L — AB (ref 135–145)

## 2016-02-20 LAB — CULTURE, BLOOD (ROUTINE X 2)
CULTURE: NO GROWTH
Culture: NO GROWTH

## 2016-02-20 LAB — CBC
HEMATOCRIT: 34.9 % — AB (ref 36.0–46.0)
HEMOGLOBIN: 11.8 g/dL — AB (ref 12.0–15.0)
MCH: 28.7 pg (ref 26.0–34.0)
MCHC: 33.8 g/dL (ref 30.0–36.0)
MCV: 84.9 fL (ref 78.0–100.0)
Platelets: 277 10*3/uL (ref 150–400)
RBC: 4.11 MIL/uL (ref 3.87–5.11)
RDW: 15 % (ref 11.5–15.5)
WBC: 13.1 10*3/uL — AB (ref 4.0–10.5)

## 2016-02-20 MED ORDER — POTASSIUM CHLORIDE CRYS ER 20 MEQ PO TBCR
40.0000 meq | EXTENDED_RELEASE_TABLET | Freq: Once | ORAL | Status: AC
  Administered 2016-02-20: 40 meq via ORAL
  Filled 2016-02-20: qty 2

## 2016-02-20 MED ORDER — HYOSCYAMINE SULFATE 0.125 MG SL SUBL
0.2500 mg | SUBLINGUAL_TABLET | Freq: Once | SUBLINGUAL | Status: AC
  Administered 2016-02-21: 0.25 mg via SUBLINGUAL
  Filled 2016-02-20: qty 2

## 2016-02-20 NOTE — Progress Notes (Signed)
Physical Therapy Treatment Patient Details Name: Kendra Campos MRN: [id number] DOB: 1924-01-02 Today's Date: 02/20/2016    History of Present Illness 82 year old female presents from an assisted living facility for decline in mental status for the past 7 days.  Per patient's daughter, whom the history was elicited from as patient was not responsive to questioning, patient began to decline since April when she was first brought to the assisted living.  For example, patient used to be ambulatory and now is wheelchair bound.  Over the past week patient has declined even more, eating little, not feeding herself and seeming more lethargic than before.  Patient's daughter also states that patient began developing swelling of her lower extremities recently and she believes she was placed on a water pill but couldn't state when exactly that was.  Patient has a history of recurrent UTI's for which patient was just treated with Cipro and was about a day away from finishing the course.  Dx: AMS/acute delirium with unknown etiology.  PMH: Arthritis, back pain, DM, HTN, pelvic pain, thyroid disease, UTI.    PT Comments    Pt received sitting up in the chair and was agreeable to PT tx, however she expressed that she needed to use the restroom.  Attempted multiple times to have pt perform sit<>stand with RW, however, pt not willing to attempt due to fear of falling.  Maxi move utilized to assist pt BTB, and she required Max A for rolling several times in order to get sling out from underneath her, and properly position on the bed pan.  Both RN and nurse tech notified of pt's position.  Continue to recommend SNF.  Follow Up Recommendations  SNF     Equipment Recommendations  None recommended by PT    Recommendations for Other Services OT consult     Precautions / Restrictions Precautions Precautions: Fall Precaution Comments: due to immobility Restrictions Other Position/Activity Restrictions:  Uncertain of reasoning for weight bearing status - pt's dry erase board states that she is to be NWB unless her braces are on.  No orders for WB formally in the chart.     Mobility  Bed Mobility Overal bed mobility: Needs Assistance Bed Mobility: Rolling Rolling: Max assist (Rolling multiple times each direction to get hoyer sling and gait belt out from under pt. )            Transfers Overall transfer level: Needs assistance               General transfer comment: Attempted multiple times to have pt perform sit<>stand transfer with RW, however pt refused, stating that she was going to fall, and that she doesn't walk.  Therefore, hoyer lift used to transfer pt BTB due to pt being insistant that "I don't walk"   Ambulation/Gait                 Stairs            Wheelchair Mobility    Modified Rankin (Stroke Patients Only)       Balance                                    Cognition Arousal/Alertness: Awake/alert Behavior During Therapy: WFL for tasks assessed/performed Overall Cognitive Status: Difficult to assess  Exercises      General Comments        Pertinent Vitals/Pain Pain Assessment: No/denies pain    Home Living                      Prior Function            PT Goals (current goals can now be found in the care plan section) Acute Rehab PT Goals Patient Stated Goal: Improve strength and transfers.  PT Goal Formulation: With family Time For Goal Achievement: 02/22/16 Potential to Achieve Goals: Fair Progress towards PT goals: Not progressing toward goals - comment    Frequency    Min 3X/week      PT Plan      Co-evaluation             End of Session Equipment Utilized During Treatment: Gait belt Activity Tolerance: Other (comment) (tx limited by fear of falling) Patient left: in bed;with call bell/phone within reach;Other (comment) (on the bed pan)     Time:  [id number] PT Time Calculation (min) (ACUTE ONLY): 37 min  Charges:  $Therapeutic Activity: 23-37 mins                    G Codes:      Beth Rajiv Parlato, PT, DPT X: [phone]

## 2016-02-20 NOTE — Care Management Important Message (Signed)
Important Message  Patient Details  Name: Kendra Campos MRN: [id number] Date of Birth: 03/08/1924   Medicare Important Message Given:  Yes    Tyreonna Czaplicki, Chrystine OilerSharley Diane, RN 02/20/2016, 1:17 PM

## 2016-02-20 NOTE — Progress Notes (Signed)
PROGRESS NOTE    Kendra Campos  ZOX:[id number] DOB: 05/10/1924 DOA: 02/14/2016 PCP: Colette RibasGOLDING, JOHN CABOT, MD   Brief Narrative:  Mrs Kendra KidneyCrutchfield is a 82 year old female who had been a resident at an assisted facility transferred to the emergency room overnight for further workup and evaluation of functional decline. Her daughter had reported a gradual functional decline since April, however had a steep decline in the last 24 hours. She became minimally responsive at her skilled nursing facility, having minimal by mouth intake, profoundly weak and having confusion. She is on multiple psychotropic medications. According to her MAR she had been on buspirone, Norco taking 1 tablet 4 times daily, Ativan taking 0.5 mg 3 times daily, Zoloft, trazodone. Suspect that polypharmacy likely contributed to functional decline.    Assessment & Plan:   Principal Problem:   Altered mental state Active Problems:   GERD (gastroesophageal reflux disease)   Essential hypertension   Community acquired pneumonia   Lower extremity edema   Debility   1.  Functional decline/failure to thrive -I suspect that polypharmacy a major contributor to her functional decline. Reviewing her medication administration reconciliation, she had been on multiple psychotropic medications to include buspirone, Norco, Ativan, Zoloft, trazodone. Norco and Ativan were both scheduled. -Have decreased Norco to twice a day when necessary dosing and Ativan to when necessary twice a day dosing as well.  -She was assisted out of bed to chair, 2 person assist, she is markedly deconditioned -On 02/16/2016, she appears more awake alert interactive. She doesn't appear to have evidence of benzodiazepine withdrawal, will stop Ativan completely -On 02/17/2016: Overnight she became agitated, having psychosis and paranoia, refused to take oral medications or by mouth, trial of Seroquel 25 mg twice a day -02/18/2016 will decrease her dose to 12.5 mg  by mouth twice a day as needed for agitation -02/19/2016. On this date patient showing clinical improvement, she was less agitated, was sat up for breakfast. Remains profoundly deconditioned -On 02/20/2016 she is showing significant improvements in the last 24 hours, today she was assisted out of bed to chair, she is more awake alert interactive. Family members at bedside confirming improvement. Plan is for her to [facility] once culture data is available.  2.  Aspiration pneumonia -On 02/18/2016 she spiked a fever of 100.7 as lab work revealed an elevated white count of 15,200, increased from 9,300 -She had an episode of severe agitation as I suspect she aspirated during that spell. Family members reporting that the evening of severe agitation they were having difficulties getting her to take her PO meds.  -Further workup today with chest x-ray showing new right lower lobe infiltrate -Will place her on IV Zosyn -On 02/19/2016, has remained afebrile overnight, showing overall improvement. I helped her with her breakfast, did not show signs of aspiration  3.  Urinary tract infection -As mentioned above she spiked a fever of 100.7 this morning with increase in white count to 15,200. Workup included a urinalysis that was found to be cloudy and microscopic analyses showing many bacteria and leukocytes. Significant change from previous UA on 02/14/2016. -She has a history of ESBL, with a urine culture obtained on 01/03/2016 growing Klebsiella. This organism was sensitive to ciprofloxacin, imipenem and Zosyn, resistant to, ceftriaxone, gentamicin, Bactrim. -Have started IV Zosyn which would cover this organism along with aspiration pneumonia. -On 02/20/2016 she is doing much better, I discussed case with Dr. Ninetta LightsHatcher of infectious disease who reviewed previous urinary cultures and recommended  treating with a total of 7 days of IV Zosyn.   4.  Acute encephalopathy -I suspect she  had delirium secondary to psychotropic medications -CT scan of brain negative for acute intracranial abnormality -Agitation likely resulting from multiple factors including hospitalization, polypharmacy, UTI, aspiration pneumonia  5.  Hypothyroidism -Continue Synthroid 25 g by mouth daily  6.  Hypokalemia -Lab work showed potassium of 3.0, will replace with 40 mEq of potassium chloride -On 02/20/2016 patient having potassium of 3.3, replaced.  7. Acute on chronic systolic congestive heart failure. -Transthoracic echocardiogram performed on 02/15/2016 EF of 45-50% with diffuse hypokinesis and grade 1 diastolic dysfunction. Ejection fraction slightly reduced from previous echo in 2014 at which time she had an EF of 50-55%.  -On exam had evidence of volume overload for which she was initially given a dose of IV Lasix -Will continue Lasix at 20 mg by mouth daily -On the 02/18/2016 she has had little by mouth intake due to severe agitation, delirium, I'm concerned about dehydration. Have stopped Lasix and provided gentle IV fluid hydration. -02/20/2016 since she is showing clinical improvement and started to take by mouth I have discontinued IV fluids  DVT prophylaxis: Lovenox Code Status: DO NOT RESUSCITATE Family Communication: I spoke to her daughter  Disposition Plan: She is looking much better today, I think she might be able to be discharged to skilled nursing facility in the next 24 hours on IV antibiotic therapy  Consultants:   Physical therapy   Antimicrobials:   Zosyn   Subjective: Continues to look better, today was assisted out of bed to chair  Objective: Vitals:   02/19/16 1400 02/19/16 1950 02/19/16 2152 02/20/16 0602  BP: 131/67  (!) 154/57   Pulse: 82  84 82  Resp: 18  18 18   Temp: 98.1 F (36.7 C)  98.3 F (36.8 C) 98.5 F (36.9 C)  TempSrc:   Oral Axillary  SpO2: 94% 94% 93% 94%  Weight:      Height:        Intake/Output Summary (Last 24 hours) at  02/20/16 1327 Last data filed at 02/19/16 1900  Gross per 24 hour  Intake                0 ml  Output                0 ml  Net                0 ml   Filed Weights   02/15/16 0430 02/16/16 0347 02/19/16 0500  Weight: 84.8 kg (186 lb 15.2 oz) 84.5 kg (186 lb 4.6 oz) 84.1 kg (185 lb 6.5 oz)    Examination:  General exam: She is awake, alert, interactive, overall looks better  Respiratory system: Improved lung exam, normal respiratory effort Cardiovascular system: S1 & S2 heard, RRR. No JVD, murmurs, rubs, gallops or clicks. No pedal edema. Gastrointestinal system: Abdomen is nondistended, soft and nontender. No organomegaly or masses felt. Normal bowel sounds heard. Central nervous system: Alert and oriented. No focal neurological deficits. Extremities: Anatomical deformities to bilateral feet all right worse than left, right foot having external deviation Skin: No rashes, lesions or ulcers Psychiatry: Lethargic    Data Reviewed: I have personally reviewed following labs and imaging studies  CBC:  Recent Labs Lab 02/14/16 0939  02/16/16 0552 02/17/16 0607 02/18/16 0844 02/19/16 0718 02/20/16 0637  WBC 12.1*  < > 9.4 9.3 15.2* 13.2* 13.1*  NEUTROABS 9.0*  --   --   --   --   --   --  HGB 12.5  < > 11.2* 11.2* 12.7 11.1* 11.8*  HCT 37.9  < > 33.6* 34.3* 38.3 33.6* 34.9*  MCV 84.4  < > 84.8 84.9 84.5 85.5 84.9  PLT 311  < > 272 259 251 220 277  < > = values in this interval not displayed. Basic Metabolic Panel:  Recent Labs Lab 02/16/16 0552 02/17/16 0607 02/18/16 0844 02/19/16 0718 02/20/16 0637  NA 131* 131* 132* 133* 134*  K 3.3* 3.2* 3.2* 3.5 3.3*  CL 91* 92* 92* 95* 99*  CO2 30 30 27 26 27   GLUCOSE 82 91 88 93 88  BUN 11 10 12 17  22*  CREATININE 0.69 0.67 0.87 0.89 0.82  CALCIUM 8.4* 8.4* 8.8* 8.4* 8.3*   GFR: Estimated Creatinine Clearance: 47.9 mL/min (by C-G formula based on SCr of 0.82 mg/dL). Liver Function Tests:  Recent Labs Lab 02/15/16 0542   AST 29  ALT 15  ALKPHOS 101  BILITOT 0.9  PROT 6.2*  ALBUMIN 3.5   No results for input(s): LIPASE, AMYLASE in the last 168 hours. No results for input(s): AMMONIA in the last 168 hours. Coagulation Profile: No results for input(s): INR, PROTIME in the last 168 hours. Cardiac Enzymes: No results for input(s): CKTOTAL, CKMB, CKMBINDEX, TROPONINI in the last 168 hours. BNP (last 3 results) No results for input(s): PROBNP in the last 8760 hours. HbA1C: No results for input(s): HGBA1C in the last 72 hours. CBG: No results for input(s): GLUCAP in the last 168 hours. Lipid Profile: No results for input(s): CHOL, HDL, LDLCALC, TRIG, CHOLHDL, LDLDIRECT in the last 72 hours. Thyroid Function Tests: No results for input(s): TSH, T4TOTAL, FREET4, T3FREE, THYROIDAB in the last 72 hours. Anemia Panel: No results for input(s): VITAMINB12, FOLATE, FERRITIN, TIBC, IRON, RETICCTPCT in the last 72 hours. Sepsis Labs:  Recent Labs Lab 02/14/16 1455  LATICACIDVEN 0.91    Recent Results (from the past 240 hour(s))  Urine culture     Status: None   Collection Time: 02/14/16  9:41 AM  Result Value Ref Range Status   Specimen Description URINE, CATHETERIZED  Final   Special Requests NONE  Final   Culture NO GROWTH Performed at [facility]   Final   Report Status 02/15/2016 FINAL  Final  Culture, blood (routine x 2)     Status: None   Collection Time: 02/14/16  1:52 PM  Result Value Ref Range Status   Specimen Description BLOOD LEFT HAND  Final   Special Requests BOTTLES DRAWN AEROBIC AND ANAEROBIC [facility] EACH  Final   Culture NO GROWTH 6 DAYS  Final   Report Status 02/20/2016 FINAL  Final  Culture, blood (routine x 2)     Status: None   Collection Time: 02/14/16  2:04 PM  Result Value Ref Range Status   Specimen Description BLOOD RIGHT HAND  Final   Special Requests BOTTLES DRAWN AEROBIC ONLY 4CC  Final   Culture NO GROWTH 6 DAYS  Final   Report Status 02/20/2016 FINAL  Final    MRSA PCR Screening     Status: None   Collection Time: 02/14/16  6:00 PM  Result Value Ref Range Status   MRSA by PCR NEGATIVE NEGATIVE Final    Comment:        The GeneXpert MRSA Assay (FDA approved for NASAL specimens only), is one component of a comprehensive MRSA colonization surveillance program. It is not intended to diagnose MRSA infection nor to guide or monitor treatment for MRSA infections.  Radiology Studies: No results found.      Scheduled Meds: . acetaminophen  1,000 mg Oral TID  . busPIRone  7.5 mg Oral TID  . enoxaparin (LOVENOX) injection  40 mg Subcutaneous Q24H  . levothyroxine  25 mcg Oral QAC breakfast  . piperacillin-tazobactam (ZOSYN)  IV  3.375 g Intravenous Q8H  . potassium chloride  40 mEq Oral Once  . potassium chloride  40 mEq Oral Once  . sertraline  25 mg Oral Daily   Continuous Infusions:     LOS: 6 days    Time spent: 35 min    Jeralyn Bennett, MD [facility] Pager [phone]  If 7PM-7AM, please contact night-coverage [email or web address] Password [email or web address] 02/20/2016, 1:27 PM

## 2016-02-21 ENCOUNTER — Inpatient Hospital Stay (HOSPITAL_COMMUNITY): Payer: Medicare Other

## 2016-02-21 DIAGNOSIS — K631 Perforation of intestine (nontraumatic): Secondary | ICD-10-CM | POA: Diagnosis present

## 2016-02-21 LAB — CBC
HCT: 36.1 % (ref 36.0–46.0)
Hemoglobin: 11.7 g/dL — ABNORMAL LOW (ref 12.0–15.0)
MCH: 27.8 pg (ref 26.0–34.0)
MCHC: 32.4 g/dL (ref 30.0–36.0)
MCV: 85.7 fL (ref 78.0–100.0)
PLATELETS: 287 10*3/uL (ref 150–400)
RBC: 4.21 MIL/uL (ref 3.87–5.11)
RDW: 15.2 % (ref 11.5–15.5)
WBC: 14.2 10*3/uL — ABNORMAL HIGH (ref 4.0–10.5)

## 2016-02-21 LAB — BASIC METABOLIC PANEL
Anion gap: 10 (ref 5–15)
BUN: 22 mg/dL — AB (ref 6–20)
CALCIUM: 8.4 mg/dL — AB (ref 8.9–10.3)
CO2: 28 mmol/L (ref 22–32)
Chloride: 98 mmol/L — ABNORMAL LOW (ref 101–111)
Creatinine, Ser: 0.83 mg/dL (ref 0.44–1.00)
GFR calc Af Amer: >60 mL/min (ref >60)
GLUCOSE: 104 mg/dL — AB (ref 65–99)
Potassium: 3.3 mmol/L — ABNORMAL LOW (ref 3.5–5.1)
Sodium: 136 mmol/L (ref 135–145)

## 2016-02-21 LAB — CREATININE, SERUM
Creatinine, Ser: 0.89 mg/dL (ref 0.44–1.00)
GFR, EST NON AFRICAN AMERICAN: 55 mL/min — AB (ref >60)

## 2016-02-21 MED ORDER — POTASSIUM CHLORIDE IN NACL 40-0.9 MEQ/L-% IV SOLN
INTRAVENOUS | Status: DC
  Administered 2016-02-21 – 2016-02-23 (×4): 75 mL/h via INTRAVENOUS

## 2016-02-21 MED ORDER — MORPHINE SULFATE (PF) 2 MG/ML IV SOLN
2.0000 mg | INTRAVENOUS | Status: DC | PRN
  Administered 2016-02-21 – 2016-02-24 (×9): 2 mg via INTRAVENOUS
  Filled 2016-02-21 (×9): qty 1

## 2016-02-21 MED ORDER — IOPAMIDOL (ISOVUE-300) INJECTION 61%
100.0000 mL | Freq: Once | INTRAVENOUS | Status: AC | PRN
  Administered 2016-02-21: 100 mL via INTRAVENOUS

## 2016-02-21 MED ORDER — POTASSIUM CHLORIDE 10 MEQ/100ML IV SOLN
10.0000 meq | INTRAVENOUS | Status: AC
  Administered 2016-02-21 (×2): 10 meq via INTRAVENOUS
  Filled 2016-02-21 (×2): qty 100

## 2016-02-21 NOTE — Consult Note (Addendum)
SURGICAL CONSULTATION NOTE (initial) - cpt: 99255  HISTORY OF PRESENT ILLNESS (HPI):  82 y.o. female admitted 1 week ago for aspiration pneumonia and UTI with altered mental status reported today worsening of her chronic abdominal pain, for which a CT of her abdomen and pelvis was performed. Patient reports +flatus and BM WNL without blood per rectum and denies N/V or fever/chills. She states she had a cholecystectomy ~5 years ago, which was complicated by unrecognized bowel injury during her wound closure, for which she was subsequently brought back to the OR for laparotomy with bowel resection and primary anastomosis. She reports she's had persistent and worsening pain x ~5 years since that surgery. >One year ago, patient developed a large hernia at her laparotomy incision and was told by her PMD that her risk for surgery was too high to consider repair of her hernia. Patient reports some chest pain and shortness of breath with walking that resolves with rest, though she also now denies that her pain has increased at all within the past 2 weeks.  Surgery is consulted by medical physician Dr. Vanessa Barbara in this context for evaluation and management of perforated intestine visualized on CT of abdomen and pelvis.  PAST MEDICAL HISTORY (PMH):  Past Medical History:  Diagnosis Date  . Arthritis   . Back pain   . Diabetes mellitus   . Hypercholesteremia   . Hypertension   . Pelvic pain in female 07/15/2015  . Pelvic pressure in female 07/15/2015  . Thyroid disease   . UTI (lower urinary tract infection)      PAST SURGICAL HISTORY (PSH):  Past Surgical History:  Procedure Laterality Date  . CHOLECYSTECTOMY    . gall stones       MEDICATIONS:  Prior to Admission medications   Medication Sig Start Date End Date Taking? Authorizing Provider  busPIRone (BUSPAR) 7.5 MG tablet Take 7.5 mg by mouth 3 (three) times daily.   Yes Historical Provider, MD  ciprofloxacin (CIPRO) 500 MG tablet Take 1 tablet  (500 mg total) by mouth 2 (two) times daily. 01/03/16  Yes Devoria Albe, MD  Cranberry Juice Powder 425 MG CAPS Take by mouth.   Yes Historical Provider, MD  furosemide (LASIX) 20 MG tablet Take 20 mg by mouth daily.   Yes Historical Provider, MD  HYDROcodone-acetaminophen (NORCO/VICODIN) 5-325 MG tablet Take 1 tablet by mouth 4 (four) times daily.    Yes Historical Provider, MD  levothyroxine (SYNTHROID, LEVOTHROID) 25 MCG tablet Take 25 mcg by mouth daily before breakfast.   Yes Historical Provider, MD  LORazepam (ATIVAN) 0.5 MG tablet Take 0.5 mg by mouth 3 (three) times daily.   Yes Historical Provider, MD  nystatin-triamcinolone (MYCOLOG II) cream Apply 1 application topically 2 (two) times daily.   Yes Historical Provider, MD  polyethylene glycol (MIRALAX / GLYCOLAX) packet Take 17 g by mouth daily.   Yes Historical Provider, MD  potassium chloride (K-DUR) 10 MEQ tablet Take 10 mEq by mouth daily.   Yes Historical Provider, MD  ranitidine (ZANTAC) 150 MG tablet Take 150 mg by mouth 2 (two) times daily.   Yes Historical Provider, MD  sertraline (ZOLOFT) 25 MG tablet Take 25 mg by mouth daily.   Yes Historical Provider, MD  traZODone (DESYREL) 50 MG tablet Take 50 mg by mouth at bedtime.   Yes Historical Provider, MD  trimethoprim (TRIMPEX) 100 MG tablet Take 100 mg by mouth at bedtime.   Yes Historical Provider, MD     ALLERGIES:  No  Known Allergies   SOCIAL HISTORY:  Social History   Social History  . Marital status: Widowed    Spouse name: N/A  . Number of children: N/A  . Years of education: N/A   Occupational History  . Not on file.   Social History Main Topics  . Smoking status: Never Smoker  . Smokeless tobacco: Never Used  . Alcohol use No  . Drug use: No  . Sexual activity: No   Other Topics Concern  . Not on file   Social History Narrative  . No narrative on file    The patient currently resides (home / rehab facility / nursing home): Skilled nursing facility  The  patient normally is (ambulatory / bedbound): Minimally ambulatory   FAMILY HISTORY:  History reviewed. No pertinent family history.   REVIEW OF SYSTEMS:  Constitutional: denies weight loss, fever, chills, or sweats  Eyes: denies any other vision changes, history of eye injury  ENT: denies sore throat, hearing problems  Respiratory: denies shortness of breath, wheezing  Cardiovascular: denies chest pain, palpitations  Gastrointestinal: abdominal pain as per HPI, denies N/V or diarrhea  Musculoskeletal: denies any other joint pains or cramps  Skin: denies any other rashes or skin discolorations  Neurological: denies any other headache, dizziness, weakness  Psychiatric: denies any other depression, anxiety   All other review of systems were negative   VITAL SIGNS:  Temp:  [98 F (36.7 C)-98.3 F (36.8 C)] 98.3 F (36.8 C) (09/19 0625) Pulse Rate:  [66-90] 66 (09/19 0625) Resp:  [18] 18 (09/19 0625) BP: (128-142)/(66-72) 142/71 (09/19 0625) SpO2:  [92 %-96 %] 93 % (09/19 0625) Weight:  [84.2 kg (185 lb 10 oz)] 84.2 kg (185 lb 10 oz) (09/19 0625)     Height: 5\' 4"  (162.6 cm) Weight: 84.2 kg (185 lb 10 oz) BMI (Calculated): 32.2   INTAKE/OUTPUT:  This shift: Total I/O In: 60 [P.O.:60] Out: -   Last 2 shifts: @IOLAST2SHIFTS @   PHYSICAL EXAM:  Constitutional:  -- Normal overweight body habitus  -- Awake, alert, and overall oriented x3, though some concern re: understanding of medical problems Eyes:  -- Pupils equally round and reactive to light  -- No scleral icterus  Ear, nose, and throat:  -- No jugular venous distension  Pulmonary:  -- No crackles  -- Equal breath sounds bilaterally -- Breathing non-labored at rest Cardiovascular:  -- S1, S2 present  -- No pericardial rubs Gastrointestinal:  -- Abdomen soft and non-distended with no guarding/rebound, but most tender over large ventral hernia that appears to be reducible at a well-healed midline laparotomy incision,  Right and Left abdomen somewhat tender to deep palpation (albeit less than over hernia itself) -- No other abdominal masses appreciated, pulsatile or otherwise  Musculoskeletal / Integumentary:  -- Wounds or skin discoloration: None appreciated except upper midline laparotomy incisional scar as above -- Extremities: B/L UE and LE FROM, hands and feet warm, minimal/mild B/L LE edema  Neurologic:  -- Motor function: intact and symmetric -- Sensation: intact and symmetric   Labs:  CBC:  Lab Results  Component Value Date   WBC 14.2 (H) 02/21/2016   RBC 4.21 02/21/2016   BMP:  Lab Results  Component Value Date   GLUCOSE 104 (H) 02/21/2016   CO2 28 02/21/2016   BUN 22 (H) 02/21/2016   CREATININE 0.89 02/21/2016   CREATININE 0.83 02/21/2016   CREATININE 1.09 10/17/2012   CALCIUM 8.4 (L) 02/21/2016     Imaging studies:  CT  Abdomen and Pelvis with IV contrast (02/21/2016) - personally reviewed and interpreted beyond radiology read below Free air in the abdomen and within a large ventral hernia which likely is due to strangulated small bowel in the ventral hernia and bowel perforation.  Mildly prominent small bowel loops which could be related to partial small bowel obstruction, but there is also gaseous distention of the stomach and to a lesser extent colon. Findings may be reflective of ileus.  Left colonic diverticulosis.  Assessment/Plan: (ICD-10's: K66.8, K63.1, K43.9) 82 y.o. female with pneumoperitoneum suggesting intestinal perforation associated with large wide fascial defect ventral hernia and progression of chronic abdominal pain without clinical or significant radiographic evidence of bowel obstruction, complicated by relatively mild leukocytosis, aspiration pneumonia, UTI, and pertinent comorbidities including advanced age, diabetes mellitus, questionable myocardial angina with dyspnea on exertion, HTN, HLD, unspecified thyroid disease, and chronic back pain with  osteoarthritis.   - patient's co-morbidities and overall condition exceed the critical care resources of [facility] if surgical intervention were to be considered, in which context patient would be unlikely to survive  - during course of discussion with patient and her daughter, hospitalist apparently discussed patient with surgery at [facility], who stated surgery would not be offered to this patient due to her co-morbidities and advanced age with high anticipated risk of peri-operative mortality  - all risks, benefits, and alternatives to operative, non-operative, and palliative management (including consideration for possible transfer to [facility], where patient and her family have previously received some care if patient would like further consideration for surgery) were discussed extensively with the patient and her daughter, including anticipated mortality without intervention and high risk of death, possible colostomy and VDRF with surgery, and patient and her daughter express clear understanding  - patient and her daughter express their wishes are to proceed with non-operative management with IV antibiotics and pain control with subsequent exclusively palliative management (care focused on patient's comfort) without further consideration for surgery if her condition worsens despite antibiotics  - this information including patient's wishes and treatment plan were discussed with medical attending  - medical management of co-morbidities as per primary medical team  - will follow with serial abdominal exams along with the primary team  - please call if any changes or questions  - DVT prophylaxis  All of the above findings and recommendations were discussed with the patient and her daughter, and all of her and family's questions were answered to their expressed satisfaction.  Thank you for the opportunity to participate in this patient's care.   -- Scherrie Gerlach Earlene Plater, MD,  [facility] [place]: [facility] General [facility] Vascular Surgery Office: [phone]

## 2016-02-21 NOTE — Care Management Note (Signed)
Case Management Note  Patient Details  Name: Kendra Campos MRN: [id number] Date of Birth: 12/10/1923   If discussed at Long Length of Stay Meetings, dates discussed:    Additional Comments: 02/21/2016  [name], Chrystine OilerSharley Diane, RN 02/21/2016, 12:37 PM

## 2016-02-21 NOTE — Progress Notes (Signed)
Physical Therapy Treatment Patient Details Name: Kendra Campos MRN: [id number] DOB: 04/13/1924 Today's Date: 02/21/2016    History of Present Illness 82 year old female presents from an assisted living facility for decline in mental status for the past 7 days.  Per patient's daughter, whom the history was elicited from as patient was not responsive to questioning, patient began to decline since April when she was first brought to the assisted living.  For example, patient used to be ambulatory and now is wheelchair bound.  Over the past week patient has declined even more, eating little, not feeding herself and seeming more lethargic than before.  Patient's daughter also states that patient began developing swelling of her lower extremities recently and she believes she was placed on a water pill but couldn't state when exactly that was.  Patient has a history of recurrent UTI's for which patient was just treated with Cipro and was about a day away from finishing the course.  Dx: AMS/acute delirium with unknown etiology.  PMH: Arthritis, back pain, DM, HTN, pelvic pain, thyroid disease, UTI.    PT Comments    Nursing had just gotten patient up with hoyer lift upon entering room.  Began to work on LE therex and patient became very upset and appeared to be confused stating that we were going to break her shoulder.  Grand daughter came in and reassured patient that we were only there to help.  Able to calm pateint down and discussed attempting to stand, however pateint was adamant she was not going to attempt this.  MD then came in and requested pateint be transferred back to bed as surgeon was coming to consult patient and [facility]    Follow Up Recommendations  SNF     Equipment Recommendations  None recommended by PT    Recommendations for Other Services OT consult     Precautions / Restrictions Precautions Precautions: Fall Precaution Comments: due to  immobility Restrictions Weight Bearing Restrictions: Yes RLE Weight Bearing: Touchdown weight bearing LLE Weight Bearing: Touchdown weight bearing Other Position/Activity Restrictions: Uncertain of reasoning for weight bearing status - pt's dry erase board states that she is to be NWB unless her braces are on.  No orders for WB formally in the chart.     Mobility  Bed Mobility               General bed mobility comments: Pt was transferred to chair via hoyer lift with nursing and back to bed via [employer]  Transfers                    Ambulation/Gait                 Stairs            Wheelchair Mobility    Modified Rankin (Stroke Patients Only)       Balance                                    Cognition Arousal/Alertness: Awake/alert   Overall Cognitive Status: Difficult to assess                      Exercises General Exercises - Lower Extremity Long Arc Quad: AROM;Both;10 reps    General Comments        Pertinent Vitals/Pain Pain Assessment: No/denies pain  Home Living                      Prior Function            PT Goals (current goals can now be found in the care plan section) Progress towards PT goals: Not progressing toward goals - comment    Frequency    Min 3X/week              End of Session   Activity Tolerance: Treatment limited secondary to agitation (tx limited by fear of falling) Patient left: in bed;with call bell/phone within reach     Time: 1030-1055 PT Time Calculation (min) (ACUTE ONLY): 25 min  Charges:  $Therapeutic Activity: 8-22 mins                    G CodesLurena Nida, PTA/CLT [phone]  02/21/2016, 11:58 AM

## 2016-02-21 NOTE — Progress Notes (Signed)
Pt c/o diffuse abd pain. On assessment hyper bs abd dist soft tender. Dr  schorr aware

## 2016-02-21 NOTE — Progress Notes (Signed)
PROGRESS NOTE    Kendra Campos  ZOX:[id number] DOB: 1923/09/19 DOA: 02/14/2016 PCP: Colette Ribas, MD   Interim summary:  Kendra Campos is a 82 year old female who had been a resident at an assisted facility transferred to the emergency room on 02/14/2016 from her assisted living facility for further workup and evaluation of functional decline. Her daughter had reported a gradual functional decline since April, however had a steep decline in the last 24 hours. She became minimally responsive at her skilled nursing facility, having minimal by mouth intake, profoundly weak and having confusion. She is on multiple psychotropic medications. According to her MAR she had been on buspirone, Norco taking 1 tablet 4 times daily, Ativan taking 0.5 mg 3 times daily, Zoloft, trazodone. Suspect that polypharmacy likely contributed to functional decline. This hospitalization was complicated by the development of severe agitation/delirium. On the morning of 02/18/2016 she spiked a temperature 100.7. This was further worked up with a chest x-ray and urinalysis. Chest x-ray showed right lower lobe infiltrate suspicious for aspiration pneumonia. Her urine also came back positive for UTI. She has a history of ESBL Klebsiella growing from urine culture on 01/03/2016. Based on those susceptibilities she was started on Zosyn on 02/18/2016. On 02/21/2016 she had complained of abdominal pain over the past day. This was further worked up with a CT scan of abdomen and pelvis which radiology reported free air within abdomen and with large ventral hernia. Radiologist felt this could be related to straining with a small bowel ventral hernia. Dr. Earlene Plater of general surgery consulted. Also spoke with Dr. Janee Morn of general surgery was scored hospital. All of agreement that surgical risk was too high and recommended taking a nonoperative approach treating with IV antibiotics, IV fluids, pain management. If she further  decompensates would recommend transitioning to full comfort measures.   Assessment & Plan:   Principal Problem:   Altered mental state Active Problems:   GERD (gastroesophageal reflux disease)   Essential hypertension   Community acquired pneumonia   Lower extremity edema   Debility   Bowel perforation (HCC)   1. Bowel perforation. -Developing during this hospitalization. Over the past 24 hours she has complained of abdominal pain which was further worked up with a CT scan of abdomen and pelvis this morning. This study revealed free air in the abdomen and within a large ventral hernia. Radiologist felt that this could be due to straining with small bowel and the ventral hernia. -Interestingly she has not had much nausea/vomiting, having her last bowel movement yesterday evening.  -Case was discussed with both Dr. Earlene Plater and with Dr. Janee Morn of general surgery. Having advanced age, multiple comorbidities, including profound deconditioning from prolonged hospitalization, aspiration pneumonia, ESBL UTI, she would be a high risk surgical candidate. Surgeons recommended taking nonoperative approach. -Plan to continue antibiotic therapy with Zosyn, provide IV fluids, supportive care.   2.  Aspiration pneumonia -On 02/18/2016 she spiked a fever of 100.7 as lab work revealed an elevated white count of 15,200, increased from 9,300 -She had an episode of severe agitation as I suspect she aspirated during that spell. Family members reporting that the evening of severe agitation they were having difficulties getting her to take her PO meds.  -Further workup today with chest x-ray showing new right lower lobe infiltrate -Was started on IV Zosyn on 02/18/2016 -On 02/19/2016, has remained afebrile overnight, showing overall improvement. I helped her with her breakfast, did not show signs of aspiration  3.  Urinary tract  infection -As mentioned above she spiked a fever of 100.7 this morning with  increase in white count to 15,200. Workup included a urinalysis that was found to be cloudy and microscopic analyses showing many bacteria and leukocytes. Significant change from previous UA on 02/14/2016. -She has a history of ESBL, with a urine culture obtained on 01/03/2016 growing Klebsiella. This organism was sensitive to ciprofloxacin, imipenem and Zosyn, resistant to, ceftriaxone, gentamicin, Bactrim. -Have started IV Zosyn which would cover this organism along with aspiration pneumonia. -On 02/20/2016 she is doing much better, I discussed case with Dr. Ninetta Lights of infectious disease who reviewed previous urinary cultures and recommended treating with a total of 7 days of IV Zosyn.   4.  Acute encephalopathy -I suspect she had delirium secondary to psychotropic medications -CT scan of brain negative for acute intracranial abnormality -Agitation likely resulting from multiple factors including hospitalization, polypharmacy, UTI, aspiration pneumonia  5.  Hypothyroidism -Continue Synthroid 25 g by mouth daily  6.  Hypokalemia -Lab work showed potassium of 3.0, will replace with 40 mEq of potassium chloride -On 02/21/2016 Potassium remains low. Will replace with IV potassium  7. Acute on chronic systolic congestive heart failure. -Transthoracic echocardiogram performed on 02/15/2016 EF of 45-50% with diffuse hypokinesis and grade 1 diastolic dysfunction. Ejection fraction slightly reduced from previous echo in 2014 at which time she had an EF of 50-55%.  -On exam had evidence of volume overload for which she was initially given a dose of IV Lasix -Will continue Lasix at 20 mg by mouth daily -On the 02/18/2016 she has had little by mouth intake due to severe agitation, delirium, I'm concerned about dehydration. Have stopped Lasix and provided gentle IV fluid hydration. -She is on IV fluids, remains euvolemic, continue monitoring body status  2.  Functional decline/failure to thrive -I  suspect that polypharmacy a major contributor to her functional decline. Reviewing her medication administration reconciliation, she had been on multiple psychotropic medications to include buspirone, Norco, Ativan, Zoloft, trazodone. Norco and Ativan were both scheduled. -Have decreased Norco to twice a day when necessary dosing and Ativan to when necessary twice a day dosing as well.  -She was assisted out of bed to chair, 2 person assist, she is markedly deconditioned -On 02/16/2016, she appears more awake alert interactive. She doesn't appear to have evidence of benzodiazepine withdrawal, will stop Ativan completely -On 02/17/2016: Overnight she became agitated, having psychosis and paranoia, refused to take oral medications or by mouth, trial of Seroquel 25 mg twice a day -02/18/2016 will decrease her dose to 12.5 mg by mouth twice a day as needed for agitation -02/19/2016. On this date patient showing clinical improvement, she was less agitated, was sat up for breakfast. Remains profoundly deconditioned -On 02/20/2016 she is showing significant improvements in the last 24 hours, today she was assisted out of bed to chair, she is more awake alert interactive. Family members at bedside confirming improvement. Plan is for her to receive rehabilitation at [facility]   DVT prophylaxis: Lovenox Code Status: DO NOT RESUSCITATE Family Communication: I spoke to her daughter  Disposition Plan: Initially the plan had been to discharge her today to the [facility], this has been held due to developing bowel perforation.  Consultants:   Physical therapy  Gen. surgery   Antimicrobials:   Zosyn  Subjective: She complains of abdominal pain seemingly worse in the last 24 hours.  Objective: Vitals:   02/20/16 1400 02/20/16 2015 02/20/16 2237 02/21/16 0625  BP: 136/66  128/72 (!) 142/71  Pulse: 90  74 66  Resp: 18  18 18   Temp: 98.2 F (36.8 C)  98 F (36.7 C) 98.3 F (36.8 C)  TempSrc:    Axillary Axillary  SpO2: 96% 92% 93% 93%  Weight:    84.2 kg (185 lb 10 oz)  Height:        Intake/Output Summary (Last 24 hours) at 02/21/16 1324 Last data filed at 02/21/16 0810  Gross per 24 hour  Intake               60 ml  Output                0 ml  Net               60 ml   Filed Weights   02/16/16 0347 02/19/16 0500 02/21/16 0625  Weight: 84.5 kg (186 lb 4.6 oz) 84.1 kg (185 lb 6.5 oz) 84.2 kg (185 lb 10 oz)    Examination:  General exam: Ill-appearing Respiratory system: Improved lung exam, normal respiratory effort Cardiovascular system: S1 & S2 heard, RRR. No JVD, murmurs, rubs, gallops or clicks. No pedal edema. Gastrointestinal system: The last 24 hours her abdomen is painful to palpation over her periumbilical region, has ventral hernia present. Central nervous system: Alert and oriented. No focal neurological deficits. Extremities: Anatomical deformities to bilateral feet all right worse than left, right foot having external deviation Skin: No rashes, lesions or ulcers Psychiatry: Lethargic    Data Reviewed: I have personally reviewed following labs and imaging studies  CBC:  Recent Labs Lab 02/17/16 0607 02/18/16 0844 02/19/16 0718 02/20/16 0637 02/21/16 0617  WBC 9.3 15.2* 13.2* 13.1* 14.2*  HGB 11.2* 12.7 11.1* 11.8* 11.7*  HCT 34.3* 38.3 33.6* 34.9* 36.1  MCV 84.9 84.5 85.5 84.9 85.7  PLT 259 251 220 277 287   Basic Metabolic Panel:  Recent Labs Lab 02/17/16 0607 02/18/16 0844 02/19/16 0718 02/20/16 0637 02/21/16 0617  NA 131* 132* 133* 134* 136  K 3.2* 3.2* 3.5 3.3* 3.3*  CL 92* 92* 95* 99* 98*  CO2 30 27 26 27 28   GLUCOSE 91 88 93 88 104*  BUN 10 12 17  22* 22*  CREATININE 0.67 0.87 0.89 0.82 0.83  0.89  CALCIUM 8.4* 8.8* 8.4* 8.3* 8.4*   GFR: Estimated Creatinine Clearance: 44.1 mL/min (by C-G formula based on SCr of 0.89 mg/dL). Liver Function Tests:  Recent Labs Lab 02/15/16 0542  AST 29  ALT 15  ALKPHOS 101  BILITOT  0.9  PROT 6.2*  ALBUMIN 3.5   No results for input(s): LIPASE, AMYLASE in the last 168 hours. No results for input(s): AMMONIA in the last 168 hours. Coagulation Profile: No results for input(s): INR, PROTIME in the last 168 hours. Cardiac Enzymes: No results for input(s): CKTOTAL, CKMB, CKMBINDEX, TROPONINI in the last 168 hours. BNP (last 3 results) No results for input(s): PROBNP in the last 8760 hours. HbA1C: No results for input(s): HGBA1C in the last 72 hours. CBG: No results for input(s): GLUCAP in the last 168 hours. Lipid Profile: No results for input(s): CHOL, HDL, LDLCALC, TRIG, CHOLHDL, LDLDIRECT in the last 72 hours. Thyroid Function Tests: No results for input(s): TSH, T4TOTAL, FREET4, T3FREE, THYROIDAB in the last 72 hours. Anemia Panel: No results for input(s): VITAMINB12, FOLATE, FERRITIN, TIBC, IRON, RETICCTPCT in the last 72 hours. Sepsis Labs:  Recent Labs Lab 02/14/16 1455  LATICACIDVEN 0.91    Recent Results (from the past  240 hour(s))  Urine culture     Status: None   Collection Time: 02/14/16  9:41 AM  Result Value Ref Range Status   Specimen Description URINE, CATHETERIZED  Final   Special Requests NONE  Final   Culture [facility] GROWTH [facility]   Final   Report Status 02/15/2016 FINAL  Final  Culture, blood (routine x 2)     Status: None   Collection Time: 02/14/16  1:52 PM  Result Value Ref Range Status   Specimen Description BLOOD LEFT HAND  Final   Special Requests BOTTLES DRAWN AEROBIC AND ANAEROBIC [facility]7CC EACH  Final   Culture NO GROWTH 6 DAYS  Final   Report Status 02/20/2016 FINAL  Final  Culture, blood (routine x 2)     Status: None   Collection Time: 02/14/16  2:04 PM  Result Value Ref Range Status   Specimen Description BLOOD RIGHT HAND  Final   Special Requests BOTTLES DRAWN AEROBIC ONLY 4CC  Final   Culture NO GROWTH 6 DAYS  Final   Report Status 02/20/2016 FINAL  Final  MRSA PCR Screening     Status: None    Collection Time: 02/14/16  6:00 PM  Result Value Ref Range Status   MRSA by PCR NEGATIVE NEGATIVE Final    Comment:        The GeneXpert MRSA Assay (FDA approved for NASAL specimens only), is one component of a comprehensive MRSA colonization surveillance program. It is not intended to diagnose MRSA infection nor to guide or monitor treatment for MRSA infections.          Radiology Studies: Ct Abdomen Pelvis W Contrast  Result Date: 02/21/2016 CLINICAL DATA:  Abdominal pain. Gradual functional decline since April with severe decline the last 24 hours. EXAM: CT ABDOMEN AND PELVIS WITH CONTRAST TECHNIQUE: Multidetector CT imaging of the abdomen and pelvis was performed using the standard protocol following bolus administration of intravenous contrast. CONTRAST:  100mL ISOVUE-300 IOPAMIDOL (ISOVUE-300) INJECTION 61% COMPARISON:  CT 07/27/2005 FINDINGS: Lower chest: Small bilateral pleural effusions. Dependent atelectasis in the lower lobes. Cardiomegaly. Hepatobiliary: Prior cholecystectomy. No focal hepatic abnormality. Subtle nodularity to the liver contours. Pancreas: Diffuse pancreatic atrophy. No focal abnormality or ductal dilatation. Spleen: No focal abnormality.  Normal size. Adrenals/Urinary Tract: Gas is noted within the bladder, presumably from recent catheterization. Recommend clinical correlation. No hydronephrosis or renal mass. Adrenal glands unremarkable. Stomach/Bowel: There is a midline ventral hernia. There is extensive gas within the hernia sac, likely within strangulated small bowel loops. Some of this may be free air is well. There is free air in the abdomen anterior to the liver. Mildly prominent small bowel loops in the lower abdomen and upper pelvis may be related to early small bowel obstruction. Left colonic diverticulosis. Stomach is distended with gas and fluid. Vascular/Lymphatic: Dense vascular calcifications throughout the aorta and iliac vessels. No aneurysm. No  adenopathy. Reproductive: Uterus and adnexa unremarkable.  No mass. Other: Small to moderate free fluid in the pelvis. Free air noted anteriorly within the abdomen overlying the liver as well as within the ventral hernia sac. Musculoskeletal: No acute bony abnormality or focal bone lesion. Diffuse degenerative disc and facet disease throughout the lumbar spine. IMPRESSION: Free air in the abdomen and within a large ventral hernia which likely is due to strangulated small bowel in the ventral hernia and bowel perforation. Mildly prominent small bowel loops which could be related to partial small bowel obstruction, but there is also gaseous  distention of the stomach and to a lesser extent colon. Findings may be reflective of ileus. Left colonic diverticulosis. Extensive aortoiliac atherosclerosis. Small bilateral pleural effusions with dependent atelectasis. Critical Value/emergent results were called by telephone at the time of interpretation on 02/21/2016 at 10:26 am to Dr. Jeralyn Bennett , who verbally acknowledged these results. Electronically Signed   By: Charlett Nose M.D.   On: 02/21/2016 10:26        Scheduled Meds: . acetaminophen  1,000 mg Oral TID  . busPIRone  7.5 mg Oral TID  . enoxaparin (LOVENOX) injection  40 mg Subcutaneous Q24H  . levothyroxine  25 mcg Oral QAC breakfast  . piperacillin-tazobactam (ZOSYN)  IV  3.375 g Intravenous Q8H  . potassium chloride  40 mEq Oral Once  . sertraline  25 mg Oral Daily   Continuous Infusions:     LOS: 7 days    Time spent: 40 min    Jeralyn Bennett, MD [facility] Pager [phone]  If 7PM-7AM, please contact night-coverage [email or web address] Password [email or web address] 02/21/2016, 1:24 PM

## 2016-02-22 ENCOUNTER — Encounter (HOSPITAL_COMMUNITY): Payer: Self-pay | Admitting: Primary Care

## 2016-02-22 DIAGNOSIS — Z515 Encounter for palliative care: Secondary | ICD-10-CM

## 2016-02-22 DIAGNOSIS — Z7189 Other specified counseling: Secondary | ICD-10-CM

## 2016-02-22 DIAGNOSIS — K631 Perforation of intestine (nontraumatic): Secondary | ICD-10-CM

## 2016-02-22 DIAGNOSIS — R4020 Unspecified coma: Secondary | ICD-10-CM

## 2016-02-22 MED ORDER — POTASSIUM CHLORIDE 10 MEQ/100ML IV SOLN
10.0000 meq | INTRAVENOUS | Status: AC
  Administered 2016-02-22 (×2): 10 meq via INTRAVENOUS
  Filled 2016-02-22 (×2): qty 100

## 2016-02-22 MED ORDER — MORPHINE SULFATE (PF) 4 MG/ML IV SOLN
6.0000 mg | Freq: Once | INTRAVENOUS | Status: AC
  Administered 2016-02-22: 6 mg via SUBCUTANEOUS
  Filled 2016-02-22: qty 2

## 2016-02-22 NOTE — Clinical Social Work Note (Signed)
Discussed pt with MD. Pt has bowel perforation and due to high risk, will continue non-operative management. If condition worsens, would pursue comfort care. CSW will continue to follow for support and d/c planning.   Derenda FennelKara Taim Wurm, LCSW [phone]

## 2016-02-22 NOTE — Progress Notes (Addendum)
SURGICAL PROGRESS NOTE (cpt [id number])  Hospital Day(s): 8.   Post op day(s):  Marland Kitchen.   Interval History: Patient seen and examined, no new complaints overnight. However, patient self-removed her Left upper extremity PICC overnight during an episode of delirium, whether attributable to narcotics or underlying infectious pathology. Patient currently reports her pain is well-controlled, and she continues to pass flatus and BM without N/V or fever/chills.  Review of Systems:  Constitutional: denies fever, chills  HEENT: denies cough or congestion  Respiratory: denies any shortness of breath  Cardiovascular: denies chest pain or palpitations  Gastrointestinal: denies N/V, diarrhea or constipation  Musculoskeletal: denies pain, decreased motor or sensation  Neurological: denies HA or vision/hearing changes   Vital signs in last 24 hours: [min-max] current  Temp:  [97.6 F (36.4 C)-99.1 F (37.3 C)] 97.6 F (36.4 C) (09/20 0523) Pulse Rate:  [88-89] 89 (09/20 0523) Resp:  [16-18] 16 (09/20 0523) BP: (151-159)/(52-72) 159/72 (09/20 0523) SpO2:  [93 %-94 %] 93 % (09/20 0523)     Height: 5\' 4"  (162.6 cm) Weight: 84.2 kg (185 lb 10 oz) BMI (Calculated): 32.2   Intake/Output this shift:  No intake/output data recorded.   Intake/Output last 2 shifts:  @IOLAST2SHIFTS @   Physical Exam:  Constitutional: alert, cooperative and no distress  HENT: normocephalic without obvious abnormality  Eyes: PERRL, EOM's grossly intact and symmetric  Neuro: CN II - XII grossly intact and symmetric without deficit  Respiratory: breathing non-labored at rest  Cardiovascular: regular rate and sinus rhythm  Gastrointestinal: soft, non-tender, and non-distended with easily reducible large ventral hernia  Musculoskeletal: UE and LE FROM, no edema or wounds, NT   Labs:  CBC:  Lab Results  Component Value Date   WBC 14.2 (H) 02/21/2016   RBC 4.21 02/21/2016   BMP:  Lab Results  Component Value Date   GLUCOSE 104 (H) 02/21/2016   CO2 28 02/21/2016   BUN 22 (H) 02/21/2016   CREATININE 0.89 02/21/2016   CREATININE 0.83 02/21/2016   CREATININE 1.09 10/17/2012   CALCIUM 8.4 (L) 02/21/2016     Imaging studies: No new pertinent imaging studies    Assessment/Plan: (ICD-10's: K66.8, K63.1, K43.9) 82 y.o. female with pneumoperitoneum suggesting intestinal perforation associated with large wide fascial defect ventral hernia and progression of chronic abdominal pain without clinical or significant radiographic evidence of bowel obstruction, complicated by currently stable mild leukocytosis, aspiration pneumonia, UTI, and pertinent comorbidities including advanced age, diabetes mellitus, questionable myocardial angina with dyspnea on exertion, HTN, HLD, unspecified thyroid disease, and chronic back pain with osteoarthritis.              - IV antibiotics, trend leukocytosis   - NPO for now except moist swabs, a few ice chips  - pain control (routine +/- prn), though try to minimize narcotics  - consider clear liquids if/when pain no longer requires medication and WBC normalizes  - palliative management (care focused on comfort) if condition worsens despite antibiotics             - this information including patient's wishes and treatment plan discussed with medical team             - medical management of patient's co-morbidities as per primary medical team             - will follow with serial abdominal exams along with the primary team             - please call if any changes or  questions             - DVT prophylaxis  All of the above findings and recommendations were discussed with the patient, patient's family, and the medical team, and all of patient's and family's questions were answered to their expressed satisfaction.  -- Scherrie Gerlach Earlene Plater, MD, RPVI [place]: [facility] General and Vascular Surgery Office: [phone]

## 2016-02-22 NOTE — Progress Notes (Signed)
PROGRESS NOTE    Kendra Campos  WUJ:[id number] DOB: 1923-09-17 DOA: 02/14/2016 PCP: Colette Ribas, MD   Interim summary:  Kendra Campos is a 82 year old female who had been a resident at an assisted facility transferred to the emergency room on 02/14/2016 from her assisted living facility for further workup and evaluation of functional decline. Her daughter had reported a gradual functional decline since April, however had a steep decline in the last 24 hours. She became minimally responsive at her skilled nursing facility, having minimal by mouth intake, profoundly weak and having confusion. She is on multiple psychotropic medications. According to her MAR she had been on buspirone, Norco taking 1 tablet 4 times daily, Ativan taking 0.5 mg 3 times daily, Zoloft, trazodone. Suspect that polypharmacy likely contributed to functional decline. This hospitalization was complicated by the development of severe agitation/delirium. On the morning of 02/18/2016 she spiked a temperature 100.7. This was further worked up with a chest x-ray and urinalysis. Chest x-ray showed right lower lobe infiltrate suspicious for aspiration pneumonia. Her urine also came back positive for UTI. She has a history of ESBL Klebsiella growing from urine culture on 01/03/2016. Based on those susceptibilities she was started on Zosyn on 02/18/2016. On 02/21/2016 she had complained of abdominal pain over the past day. This was further worked up with a CT scan of abdomen and pelvis which radiology reported free air within abdomen and with large ventral hernia. Radiologist felt this could be related to straining with a small bowel ventral hernia. Dr. Earlene Plater of general surgery consulted. Also spoke with Dr. Janee Campos of general surgery was scored hospital. All of agreement that surgical risk was too high and recommended taking a nonoperative approach treating with IV antibiotics, IV fluids, pain management. If she further  decompensates would recommend transitioning to full comfort measures.   Assessment & Plan:   Principal Problem:   Altered mental state Active Problems:   GERD (gastroesophageal reflux disease)   Essential hypertension   Community acquired pneumonia   Lower extremity edema   Debility   Bowel perforation (HCC)   Palliative care encounter   Goals of care, counseling/discussion   1. Bowel perforation. -Plan to continue antibiotic therapy with Zosyn, provide IV fluids, supportive care.  -Discussed with Dr. Earlene Plater. Age and co-morbidities would preclude surgical intervention. -Plan to treat conservatively with bowel rest and abx for another 48 hours; if no improvement, plan to transition to full comfort care. -Palliative care consultation has been requested.  2.  Aspiration pneumonia -Continue abx.  3.  Urinary tract infection -She has a history of ESBL, with a urine culture obtained on 01/03/2016 growing Klebsiella. This organism was sensitive to ciprofloxacin, imipenem and Zosyn, resistant to, ceftriaxone, gentamicin, Bactrim. -Have started IV Zosyn which would cover this organism along with aspiration pneumonia. -My partner discussed case with Dr. Ninetta Lights of infectious disease who reviewed previous urinary cultures and recommended treating with a total of 7 days of IV Zosyn.   4.  Acute encephalopathy -I suspect she had delirium secondary to psychotropic medications -CT scan of brain negative for acute intracranial abnormality -Agitation likely resulting from multiple factors including hospitalization, polypharmacy, UTI, aspiration pneumonia  5.  Hypothyroidism -Synthroid on hold due to NPO state.  6.  Hypokalemia -3.3. Will give 2 runs of IV KCl given her NPO state.  7. Acute on chronic systolic congestive heart failure. -Still appears slightly hypervolemic despite comatose state.  2.  Functional decline/failure to thrive -Due to age, polypharmacy and  medical  co-morbidities.  DVT prophylaxis: Lovenox Code Status: DO NOT RESUSCITATE Family Communication: I spoke to her daughter  Disposition Plan: Suspect will need residential hospice placement  Consultants:   Physical therapy  Gen. surgery   Antimicrobials:   Zosyn  Subjective: Very comatose. Does not waken during exam today.  Objective: Vitals:   02/21/16 1614 02/21/16 2002 02/21/16 2200 02/22/16 0523  BP: (!) 151/61  (!) 156/52 (!) 159/72  Pulse: 88  88 89  Resp: 18   16  Temp:   99.1 F (37.3 C) 97.6 F (36.4 C)  TempSrc:   Axillary Oral  SpO2: 94% 93% 94% 93%  Weight:      Height:        Intake/Output Summary (Last 24 hours) at 02/22/16 1603 Last data filed at 02/21/16 1700  Gross per 24 hour  Intake            11.25 ml  Output                0 ml  Net            11.25 ml   Filed Weights   02/16/16 0347 02/19/16 0500 02/21/16 0625  Weight: 84.5 kg (186 lb 4.6 oz) 84.1 kg (185 lb 6.5 oz) 84.2 kg (185 lb 10 oz)    Examination:  General exam: Ill-appearing Respiratory system: Improved lung exam, normal respiratory effort Cardiovascular system: S1 & S2 heard, RRR. No JVD, murmurs, rubs, gallops or clicks. No pedal edema. Gastrointestinal system: The last 24 hours her abdomen is painful to palpation over her periumbilical region, has ventral hernia present. Central nervous system: Alert and oriented. No focal neurological deficits. Extremities: Anatomical deformities to bilateral feet all right worse than left, right foot having external deviation Skin: No rashes, lesions or ulcers Psychiatry: Lethargic    Data Reviewed: I have personally reviewed following labs and imaging studies  CBC:  Recent Labs Lab 02/17/16 0607 02/18/16 0844 02/19/16 0718 02/20/16 0637 02/21/16 0617  WBC 9.3 15.2* 13.2* 13.1* 14.2*  HGB 11.2* 12.7 11.1* 11.8* 11.7*  HCT 34.3* 38.3 33.6* 34.9* 36.1  MCV 84.9 84.5 85.5 84.9 85.7  PLT 259 251 220 277 287   Basic Metabolic  Panel:  Recent Labs Lab 02/17/16 0607 02/18/16 0844 02/19/16 0718 02/20/16 0637 02/21/16 0617  NA 131* 132* 133* 134* 136  K 3.2* 3.2* 3.5 3.3* 3.3*  CL 92* 92* 95* 99* 98*  CO2 30 27 26 27 28   GLUCOSE 91 88 93 88 104*  BUN 10 12 17  22* 22*  CREATININE 0.67 0.87 0.89 0.82 0.83  0.89  CALCIUM 8.4* 8.8* 8.4* 8.3* 8.4*   GFR: Estimated Creatinine Clearance: 44.1 mL/min (by C-G formula based on SCr of 0.89 mg/dL). Liver Function Tests: No results for input(s): AST, ALT, ALKPHOS, BILITOT, PROT, ALBUMIN in the last 168 hours. No results for input(s): LIPASE, AMYLASE in the last 168 hours. No results for input(s): AMMONIA in the last 168 hours. Coagulation Profile: No results for input(s): INR, PROTIME in the last 168 hours. Cardiac Enzymes: No results for input(s): CKTOTAL, CKMB, CKMBINDEX, TROPONINI in the last 168 hours. BNP (last 3 results) No results for input(s): PROBNP in the last 8760 hours. HbA1C: No results for input(s): HGBA1C in the last 72 hours. CBG: No results for input(s): GLUCAP in the last 168 hours. Lipid Profile: No results for input(s): CHOL, HDL, LDLCALC, TRIG, CHOLHDL, LDLDIRECT in the last 72 hours. Thyroid Function Tests: No results for input(s):  TSH, T4TOTAL, FREET4, T3FREE, THYROIDAB in the last 72 hours. Anemia Panel: No results for input(s): VITAMINB12, FOLATE, FERRITIN, TIBC, IRON, RETICCTPCT in the last 72 hours. Sepsis Labs: No results for input(s): PROCALCITON, LATICACIDVEN in the last 168 hours.  Recent Results (from the past 240 hour(s))  Urine culture     Status: None   Collection Time: 02/14/16  9:41 AM  Result Value Ref Range Status   Specimen Description URINE, CATHETERIZED  Final   Special Requests NONE  Final   Culture NO GROWTH [facility]   Final   Report Status 02/15/2016 FINAL  Final  Culture, blood (routine x 2)     Status: None   Collection Time: 02/14/16  1:52 PM  Result Value Ref Range Status    Specimen Description BLOOD LEFT HAND  Final   Special Requests BOTTLES DRAWN AEROBIC AND ANAEROBIC [facility]7CC EACH  Final   Culture NO GROWTH 6 DAYS  Final   Report Status 02/20/2016 FINAL  Final  Culture, blood (routine x 2)     Status: None   Collection Time: 02/14/16  2:04 PM  Result Value Ref Range Status   Specimen Description BLOOD RIGHT HAND  Final   Special Requests BOTTLES DRAWN AEROBIC ONLY 4CC  Final   Culture NO GROWTH 6 DAYS  Final   Report Status 02/20/2016 FINAL  Final  MRSA PCR Screening     Status: None   Collection Time: 02/14/16  6:00 PM  Result Value Ref Range Status   MRSA by PCR NEGATIVE NEGATIVE Final    Comment:        The GeneXpert MRSA Assay (FDA approved for NASAL specimens only), is one component of a comprehensive MRSA colonization surveillance program. It is not intended to diagnose MRSA infection nor to guide or monitor treatment for MRSA infections.          Radiology Studies: Ct Abdomen Pelvis W Contrast  Result Date: 02/21/2016 CLINICAL DATA:  Abdominal pain. Gradual functional decline since April with severe decline the last 24 hours. EXAM: CT ABDOMEN AND PELVIS WITH CONTRAST TECHNIQUE: Multidetector CT imaging of the abdomen and pelvis was performed using the standard protocol following bolus administration of intravenous contrast. CONTRAST:  100mL ISOVUE-300 IOPAMIDOL (ISOVUE-300) INJECTION 61% COMPARISON:  CT 07/27/2005 FINDINGS: Lower chest: Small bilateral pleural effusions. Dependent atelectasis in the lower lobes. Cardiomegaly. Hepatobiliary: Prior cholecystectomy. No focal hepatic abnormality. Subtle nodularity to the liver contours. Pancreas: Diffuse pancreatic atrophy. No focal abnormality or ductal dilatation. Spleen: No focal abnormality.  Normal size. Adrenals/Urinary Tract: Gas is noted within the bladder, presumably from recent catheterization. Recommend clinical correlation. No hydronephrosis or renal mass. Adrenal glands unremarkable.  Stomach/Bowel: There is a midline ventral hernia. There is extensive gas within the hernia sac, likely within strangulated small bowel loops. Some of this may be free air is well. There is free air in the abdomen anterior to the liver. Mildly prominent small bowel loops in the lower abdomen and upper pelvis may be related to early small bowel obstruction. Left colonic diverticulosis. Stomach is distended with gas and fluid. Vascular/Lymphatic: Dense vascular calcifications throughout the aorta and iliac vessels. No aneurysm. No adenopathy. Reproductive: Uterus and adnexa unremarkable.  No mass. Other: Small to moderate free fluid in the pelvis. Free air noted anteriorly within the abdomen overlying the liver as well as within the ventral hernia sac. Musculoskeletal: No acute bony abnormality or focal bone lesion. Diffuse degenerative disc and facet disease throughout the lumbar spine.  IMPRESSION: Free air in the abdomen and within a large ventral hernia which likely is due to strangulated small bowel in the ventral hernia and bowel perforation. Mildly prominent small bowel loops which could be related to partial small bowel obstruction, but there is also gaseous distention of the stomach and to a lesser extent colon. Findings may be reflective of ileus. Left colonic diverticulosis. Extensive aortoiliac atherosclerosis. Small bilateral pleural effusions with dependent atelectasis. Critical Value/emergent results were called by telephone at the time of interpretation on 02/21/2016 at 10:26 am to Dr. Jeralyn Bennett , who verbally acknowledged these results. Electronically Signed   By: Charlett Nose M.D.   On: 02/21/2016 10:26        Scheduled Meds: . enoxaparin (LOVENOX) injection  40 mg Subcutaneous Q24H  . piperacillin-tazobactam (ZOSYN)  IV  3.375 g Intravenous Q8H  . potassium chloride  40 mEq Oral Once   Continuous Infusions: . 0.9 % NaCl with KCl 40 mEq / L 75 mL/hr (02/22/16 0319)     LOS: 8 days      Time spent: 25 min    Chaya Jan, MD [facility] Pager [phone]  If 7PM-7AM, please contact night-coverage [email or web address] Password [email or web address] 02/22/2016, 4:03 PM

## 2016-02-22 NOTE — Care Management Important Message (Signed)
Important Message  Patient Details  Name: Hermelinda DellenFaye F Hanisch MRN: [id number] Date of Birth: 07/22/1923   Medicare Important Message Given:  Yes    Kalissa Grays, Chrystine OilerSharley Diane, RN 02/22/2016, 10:50 AM

## 2016-02-22 NOTE — Progress Notes (Signed)
Pt pulled out picc line . On assessment picc was completely intact. Pt confused restless and in pain Dr Conley Rollsle aware. New orders placed

## 2016-02-22 NOTE — Consult Note (Signed)
Consultation Note Date: 02/22/2016   Patient Name: Kendra Campos  DOB: 1924-05-11  MRN: [id number]  Age / Sex: 82 y.o., female  PCP: Kendra Found, MD Referring Physician: Micael Hampshire Campos*  Reason for Consultation: Psychosocial/spiritual support  HPI/Patient Profile: 82 y.o. female  with past medical history of Thyroid disease, diabetes, hypertension and high cholesterol, arthritis, history of cholecystectomy admitted on 02/14/2016 with UTI and pneumonia, consequently Campos to have bowel perforation which is being treated with conservative measures not surgery.   Clinical Assessment and Goals of Care: Kendra Campos is lying quietly in bed, she is sleeping soundly. She does not wake during my physical exam. There is no family of bedside at this time. She has a plan in place to treat her bowel perforation conservatively/medically, if no improvement after 48 hours, she will transition to comfort measures only. I will discuss transition to hospice home of [facility] if she is unable to recover.  Healthcare power of attorney NEXT OF KIN - No healthcare power of attorney paperwork is Campos in chart, no family at bedside at this time.   SUMMARY OF RECOMMENDATIONS   continue to treat medically/conservatively. Focus on comfort measures only if no improvement with conservative treatment.  Code Status/Advance Care Planning:  DNR - allow a natural death  Symptom Management:   per hospitalist and surgery  Palliative Prophylaxis:   Aspiration, Bowel Regimen, Frequent Pain Assessment, Palliative Wound Care and Turn Reposition  Additional Recommendations (Limitations, Scope, Preferences):  continue to treat medically/conservatively. Focus on comfort measures only if no improvement with conservative treatment.  Psycho-social/Spiritual:   Desire for further Chaplaincy support:no  Additional  Recommendations: Caregiving  Support/Resources and Grief/Bereavement Support  Prognosis:   Unable to determine, based on outcomes.   Discharge Planning: To Be Determined      Primary Diagnoses: Present on Admission: . Altered mental state . GERD (gastroesophageal reflux disease) . Essential hypertension . Community acquired pneumonia . Bowel perforation (HCC)   I have reviewed the medical record, interviewed the patient and family, and examined the patient. The following aspects are pertinent.  Past Medical History:  Diagnosis Date  . Arthritis   . Back pain   . Diabetes mellitus   . Hypercholesteremia   . Hypertension   . Pelvic pain in female 07/15/2015  . Pelvic pressure in female 07/15/2015  . Thyroid disease   . UTI (lower urinary tract infection)    Social History   Social History  . Marital status: Widowed    Spouse name: N/A  . Number of children: N/A  . Years of education: N/A   Social History Main Topics  . Smoking status: Never Smoker  . Smokeless tobacco: Never Used  . Alcohol use No  . Drug use: No  . Sexual activity: No   Other Topics Concern  . None   Social History Narrative  . None   History reviewed. No pertinent family history. Scheduled Meds: . enoxaparin (LOVENOX) injection  40 mg Subcutaneous Q24H  . piperacillin-tazobactam (ZOSYN)  IV  3.375 g Intravenous Q8H  . potassium chloride  40 mEq Oral Once   Continuous Infusions: . 0.9 % NaCl with KCl 40 mEq / L 75 mL/hr (02/22/16 0319)   PRN Meds:.albuterol, morphine injection Medications Prior to Admission:  Prior to Admission medications   Medication Sig Start Date End Date Taking? Authorizing Provider  busPIRone (BUSPAR) 7.5 MG tablet Take 7.5 mg by mouth 3 (three) times daily.   Yes Historical Provider, MD  ciprofloxacin (CIPRO) 500 MG tablet Take 1 tablet (500 mg total) by mouth 2 (two) times daily. 01/03/16  Yes Kendra AlbeIva Knapp, MD  Cranberry Juice Powder 425 MG CAPS Take by mouth.    Yes Historical Provider, MD  furosemide (LASIX) 20 MG tablet Take 20 mg by mouth daily.   Yes Historical Provider, MD  HYDROcodone-acetaminophen (NORCO/VICODIN) 5-325 MG tablet Take 1 tablet by mouth 4 (four) times daily.    Yes Historical Provider, MD  levothyroxine (SYNTHROID, LEVOTHROID) 25 MCG tablet Take 25 mcg by mouth daily before breakfast.   Yes Historical Provider, MD  LORazepam (ATIVAN) 0.5 MG tablet Take 0.5 mg by mouth 3 (three) times daily.   Yes Historical Provider, MD  nystatin-triamcinolone (MYCOLOG II) cream Apply 1 application topically 2 (two) times daily.   Yes Historical Provider, MD  polyethylene glycol (MIRALAX / GLYCOLAX) packet Take 17 g by mouth daily.   Yes Historical Provider, MD  potassium chloride (K-DUR) 10 MEQ tablet Take 10 mEq by mouth daily.   Yes Historical Provider, MD  ranitidine (ZANTAC) 150 MG tablet Take 150 mg by mouth 2 (two) times daily.   Yes Historical Provider, MD  sertraline (ZOLOFT) 25 MG tablet Take 25 mg by mouth daily.   Yes Historical Provider, MD  traZODone (DESYREL) 50 MG tablet Take 50 mg by mouth at bedtime.   Yes Historical Provider, MD  trimethoprim (TRIMPEX) 100 MG tablet Take 100 mg by mouth at bedtime.   Yes Historical Provider, MD   No Known Allergies Review of Systems  Unable to perform ROS: Acuity of condition    Physical Exam  Constitutional: No distress.  HENT:  Head: Normocephalic and atraumatic.  Cardiovascular: Normal rate.   Pulmonary/Chest: Effort normal. No respiratory distress.  Abdominal: She exhibits distension. There is no guarding.  Neurological:  Does not rouse to light touch or voice command  Skin: Skin is warm and dry.  Nursing note and vitals reviewed.   Vital Signs: BP (!) 159/72 (BP Location: Right Arm)   Pulse 89   Temp 97.6 F (36.4 C) (Oral)   Resp 16   Ht 5\' 4"  (1.626 m)   Wt 84.2 kg (185 lb 10 oz)   SpO2 93%   BMI 31.86 kg/m  Pain Assessment: PAINAD   Pain Score: Asleep   SpO2:  SpO2: 93 % O2 Device:SpO2: 93 % O2 Flow Rate: .   IO: Intake/output summary:  Intake/Output Summary (Last 24 hours) at 02/22/16 1525 Last data filed at 02/21/16 1700  Gross per 24 hour  Intake           111.25 ml  Output                0 ml  Net           111.25 ml    LBM: Last BM Date: 02/20/16 Baseline Weight: Weight: 88.5 kg (195 lb) Most recent weight: Weight: 84.2 kg (185 lb 10 oz)     Palliative Assessment/Data:   Flowsheet Rows  Flowsheet Row Most Recent Value  Intake Tab  Referral Department  Surgery  Unit at Time of Referral  Med/Surg Unit  Palliative Care Primary Diagnosis  Sepsis/Infectious Disease  Date Notified  02/22/16  Palliative Care Type  New Palliative care  Reason for referral  Psychosocial or Spiritual support  Date of Admission  02/14/16  Date first seen by Palliative Care  02/22/16  # of days Palliative referral response time  0 Day(s)  # of days IP prior to Palliative referral  8  Clinical Assessment  Palliative Performance Scale Score  20%  Pain Max last 24 hours  Not able to report  Pain Min Last 24 hours  Not able to report  Dyspnea Max Last 24 Hours  Not able to report  Dyspnea Min Last 24 hours  Not able to report  Psychosocial & Spiritual Assessment  Palliative Care Outcomes  Patient/Family meeting held?  No  Palliative Care Outcomes  Provided psychosocial or spiritual support  Patient/Family wishes: Interventions discontinued/not started   Mechanical Ventilation  Palliative Care follow-up planned  -- [Follow-up while at [facility]]      Time In: 1120 Time Out: 1150 Time Total: 30 minutes Greater than 50%  of this time was spent counseling and coordinating care related to the above assessment and plan.  Signed by: Katheran Awe, NP   Please contact Palliative Medicine Team phone at [phone] for questions and concerns.  For individual provider: See Loretha Stapler

## 2016-02-23 LAB — CBC
HEMATOCRIT: 35.5 % — AB (ref 36.0–46.0)
HEMOGLOBIN: 11.1 g/dL — AB (ref 12.0–15.0)
MCH: 27.3 pg (ref 26.0–34.0)
MCHC: 31.3 g/dL (ref 30.0–36.0)
MCV: 87.4 fL (ref 78.0–100.0)
Platelets: 311 10*3/uL (ref 150–400)
RBC: 4.06 MIL/uL (ref 3.87–5.11)
RDW: 15.6 % — ABNORMAL HIGH (ref 11.5–15.5)
WBC: 19.1 10*3/uL — ABNORMAL HIGH (ref 4.0–10.5)

## 2016-02-23 LAB — BASIC METABOLIC PANEL
Anion gap: 8 (ref 5–15)
BUN: 27 mg/dL — ABNORMAL HIGH (ref 6–20)
CHLORIDE: 108 mmol/L (ref 101–111)
CO2: 24 mmol/L (ref 22–32)
CREATININE: 1.01 mg/dL — AB (ref 0.44–1.00)
Calcium: 8.7 mg/dL — ABNORMAL LOW (ref 8.9–10.3)
GFR calc non Af Amer: 48 mL/min — ABNORMAL LOW (ref >60)
GFR, EST AFRICAN AMERICAN: 55 mL/min — AB (ref >60)
Glucose, Bld: 79 mg/dL (ref 65–99)
POTASSIUM: 4.9 mmol/L (ref 3.5–5.1)
Sodium: 140 mmol/L (ref 135–145)

## 2016-02-23 NOTE — Progress Notes (Signed)
SURGICAL PROGRESS NOTE (cpt [id number])  Hospital Day(s): 9.   Post op day(s):  Marland Kitchen.   Interval History: Patient seen and examined, no acute events or new complaints overnight. Patient appears much more alert today, though reports some upper abdominal pain over her hernia. She otherwise reports +flatus and BM without blood and denies fever/chills, CP, or SOB.  Review of Systems:  Constitutional: denies fever, chills  HEENT: denies cough or congestion  Respiratory: denies any shortness of breath  Cardiovascular: denies chest pain or palpitations  Gastrointestinal: denies N/V, diarrhea or constipation  Musculoskeletal: denies pain, decreased motor or sensation  Neurological: denies HA or vision/hearing changes   Vital signs in last 24 hours: [min-max] current  Temp:  [98.5 F (36.9 C)-100.2 F (37.9 C)] 100.2 F (37.9 C) (09/21 0503) Pulse Rate:  [86-92] 92 (09/21 0503) Resp:  [16] 16 (09/21 0503) BP: (126-175)/(60-70) 126/70 (09/21 0503) SpO2:  [95 %] 95 % (09/21 0503) Weight:  [87.1 kg (192 lb 0.3 oz)] 87.1 kg (192 lb 0.3 oz) (09/21 0503)     Height: 5\' 4"  (162.6 cm) Weight: 87.1 kg (192 lb 0.3 oz) BMI (Calculated): 32.2   Intake/Output this shift:  No intake/output data recorded.   Intake/Output last 2 shifts:  @IOLAST2SHIFTS @   Physical Exam:  Constitutional: alert, cooperative and no distress  HENT: normocephalic without obvious abnormality  Eyes: PERRL, EOM's grossly intact and symmetric  Neuro: CN II - XII grossly intact and symmetric without deficit  Respiratory: breathing non-labored at rest  Cardiovascular: regular rate and sinus rhythm  Gastrointestinal: soft, moderately tender over reducible ventral hernia associated with well-healed upper midline laparotomy incision, non-distended  Musculoskeletal: UE and LE FROM, no edema or wounds, motor and sensation grossly intact  Labs:  CBC:  Lab Results  Component Value Date   WBC 19.1 (H) 02/23/2016   RBC 4.06  02/23/2016   BMP:  Lab Results  Component Value Date   GLUCOSE 79 02/23/2016   CO2 24 02/23/2016   BUN 27 (H) 02/23/2016   CREATININE 1.01 (H) 02/23/2016   CREATININE 1.09 10/17/2012   CALCIUM 8.7 (L) 02/23/2016     Imaging studies: No new pertinent imaging studies    Assessment/Plan: (ICD-10's: K66.8, K63.1, K43.9) 82 y.o.femalewith worsening leukocytosis today despite broad-spectrum antibiotics for pneumoperitoneum suggesting intestinal perforation associated with large wide fascial defect ventral hernia and progression of chronic abdominal pain without clinical or significant radiographic evidence of bowel obstruction, complicated by recent aspiration pneumonia, UTI, and pertinent comorbidities including advanced age, diabetes mellitus, questionable myocardial angina with dyspnea on exertion, HTN, HLD, unspecified thyroid disease, and chronic back pain with osteoarthritis.  - IV antibiotics, trend leukocytosis              - NPO for now except moist swabs, a few ice chips             - pain control (routine +/- prn), though try to minimize narcotics             - consider clear liquids if/when pain no longer requires medication and WBC normalizes             - palliative management (care focused on comfort) if condition worsens despite antibiotics - this information including patient's wishes and treatment plan discussed with medical team - medical management of patient's co-morbidities as per primary medical team - will follow with serial abdominal exams along with the primary team - please call if any changes or questions - DVT prophylaxis  All of the above findings and recommendations were discussed with the patient, patient's family, and the medical team, and all of patient's and family's questions were answered to their expressed satisfaction.  -- Scherrie Gerlach Earlene Plater, MD, RPVI [place]: [facility]  [facility] Office: [phone]

## 2016-02-23 NOTE — Progress Notes (Signed)
PROGRESS NOTE    Kendra Campos  ZOX:[id number] DOB: 12-17-1923 DOA: 02/14/2016 PCP: Colette Ribas, MD   Interim summary:  Kendra Campos is a 82 year old female who had been a resident at an assisted facility transferred to the emergency room on 02/14/2016 from her assisted living facility for further workup and evaluation of functional decline. Her daughter had reported a gradual functional decline since April, however had a steep decline in the last 24 hours. She became minimally responsive at her skilled nursing facility, having minimal by mouth intake, profoundly weak and having confusion. She is on multiple psychotropic medications. According to her MAR she had been on buspirone, Norco taking 1 tablet 4 times daily, Ativan taking 0.5 mg 3 times daily, Zoloft, trazodone. Suspect that polypharmacy likely contributed to functional decline. This hospitalization was complicated by the development of severe agitation/delirium. On the morning of 02/18/2016 she spiked a temperature 100.7. This was further worked up with a chest x-ray and urinalysis. Chest x-ray showed right lower lobe infiltrate suspicious for aspiration pneumonia. Her urine also came back positive for UTI. She has a history of ESBL Klebsiella growing from urine culture on 01/03/2016. Based on those susceptibilities she was started on Zosyn on 02/18/2016. On 02/21/2016 she had complained of abdominal pain over the past day. This was further worked up with a CT scan of abdomen and pelvis which radiology reported free air within abdomen and with large ventral hernia. Radiologist felt this could be related to straining with a small bowel ventral hernia. Dr. Earlene Plater of general surgery consulted. Also spoke with Dr. Janee Morn of general surgery was scored hospital. All of agreement that surgical risk was too high and recommended taking a nonoperative approach treating with IV antibiotics, IV fluids, pain management. If she further  decompensates would recommend transitioning to full comfort measures.   Assessment & Plan:   Principal Problem:   Altered mental state Active Problems:   GERD (gastroesophageal reflux disease)   Essential hypertension   Community acquired pneumonia   Lower extremity edema   Debility   Bowel perforation (HCC)   Palliative care encounter   Goals of care, counseling/discussion   1. Bowel perforation. -Plan to continue antibiotic therapy with Zosyn, provide IV fluids, supportive care.  -Discussed with Dr. Earlene Plater. Age and co-morbidities would preclude surgical intervention. -Plan to treat conservatively with bowel rest and abx for another 24 hours; if no improvement, plan to transition to full comfort care. -Palliative care consultation has been requested.  2.  Aspiration pneumonia -Continue abx.  3.  Urinary tract infection -She has a history of ESBL, with a urine culture obtained on 01/03/2016 growing Klebsiella. This organism was sensitive to ciprofloxacin, imipenem and Zosyn, resistant to, ceftriaxone, gentamicin, Bactrim. -Have started IV Zosyn which would cover this organism along with aspiration pneumonia. -My partner discussed case with Dr. Ninetta Lights of infectious disease who reviewed previous urinary cultures and recommended treating with a total of 7 days of IV Zosyn.   4.  Acute encephalopathy -I suspect she had delirium secondary to psychotropic medications -CT scan of brain negative for acute intracranial abnormality -Agitation likely resulting from multiple factors including hospitalization, polypharmacy, UTI, aspiration pneumonia  5.  Hypothyroidism -Synthroid on hold due to NPO state.  6.  Hypokalemia -Replaced.  7. Acute on chronic systolic congestive heart failure. -Still appears slightly hypervolemic despite comatose state.  2.  Functional decline/failure to thrive -Due to age, polypharmacy and medical co-morbidities.  DVT prophylaxis: Lovenox Code Status:  DO NOT  RESUSCITATE Family Communication: Discussed with daughter and son in law 9/21. Discussed lack of improvement. They agree with hospice home placement if no improvement by 9/22. Disposition Plan: Suspect will need residential hospice placement  Consultants:   Physical therapy  Gen. surgery   Antimicrobials:   Zosyn  Subjective: Confused, speaks non-sensically.  Objective: Vitals:   02/22/16 0523 02/22/16 1823 02/22/16 2117 02/23/16 0503  BP: (!) 159/72 (!) 175/60 (!) 163/62 126/70  Pulse: 89 86 88 92  Resp: 16 16 16 16   Temp: 97.6 F (36.4 C) 98.5 F (36.9 C) 99.3 F (37.4 C) 100.2 F (37.9 C)  TempSrc: Oral Oral Oral Oral  SpO2: 93% 95%  95%  Weight:    87.1 kg (192 lb 0.3 oz)  Height:        Intake/Output Summary (Last 24 hours) at 02/23/16 1552 Last data filed at 02/23/16 1438  Gross per 24 hour  Intake           3072.5 ml  Output                0 ml  Net           3072.5 ml   Filed Weights   02/19/16 0500 02/21/16 0625 02/23/16 0503  Weight: 84.1 kg (185 lb 6.5 oz) 84.2 kg (185 lb 10 oz) 87.1 kg (192 lb 0.3 oz)    Examination:  General exam: Ill-appearing Respiratory system: Improved lung exam, normal respiratory effort Cardiovascular system: S1 & S2 heard, RRR. No JVD, murmurs, rubs, gallops or clicks. No pedal edema. Gastrointestinal system: The last 24 hours her abdomen is painful to palpation over her periumbilical region, has ventral hernia present. Central nervous system: Alert and oriented. No focal neurological deficits. Extremities: Anatomical deformities to bilateral feet all right worse than left, right foot having external deviation Skin: No rashes, lesions or ulcers Psychiatry: Lethargic    Data Reviewed: I have personally reviewed following labs and imaging studies  CBC:  Recent Labs Lab 02/18/16 0844 02/19/16 0718 02/20/16 0637 02/21/16 0617 02/23/16 0557  WBC 15.2* 13.2* 13.1* 14.2* 19.1*  HGB 12.7 11.1* 11.8* 11.7*  11.1*  HCT 38.3 33.6* 34.9* 36.1 35.5*  MCV 84.5 85.5 84.9 85.7 87.4  PLT 251 220 277 287 311   Basic Metabolic Panel:  Recent Labs Lab 02/18/16 0844 02/19/16 0718 02/20/16 0637 02/21/16 0617 02/23/16 0557  NA 132* 133* 134* 136 140  K 3.2* 3.5 3.3* 3.3* 4.9  CL 92* 95* 99* 98* 108  CO2 27 26 27 28 24   GLUCOSE 88 93 88 104* 79  BUN 12 17 22* 22* 27*  CREATININE 0.87 0.89 0.82 0.83  0.89 1.01*  CALCIUM 8.8* 8.4* 8.3* 8.4* 8.7*   GFR: Estimated Creatinine Clearance: 39.6 mL/min (by C-G formula based on SCr of 1.01 mg/dL (H)). Liver Function Tests: No results for input(s): AST, ALT, ALKPHOS, BILITOT, PROT, ALBUMIN in the last 168 hours. No results for input(s): LIPASE, AMYLASE in the last 168 hours. No results for input(s): AMMONIA in the last 168 hours. Coagulation Profile: No results for input(s): INR, PROTIME in the last 168 hours. Cardiac Enzymes: No results for input(s): CKTOTAL, CKMB, CKMBINDEX, TROPONINI in the last 168 hours. BNP (last 3 results) No results for input(s): PROBNP in the last 8760 hours. HbA1C: No results for input(s): HGBA1C in the last 72 hours. CBG: No results for input(s): GLUCAP in the last 168 hours. Lipid Profile: No results for input(s): CHOL, HDL, LDLCALC, TRIG, CHOLHDL, LDLDIRECT in  the last 72 hours. Thyroid Function Tests: No results for input(s): TSH, T4TOTAL, FREET4, T3FREE, THYROIDAB in the last 72 hours. Anemia Panel: No results for input(s): VITAMINB12, FOLATE, FERRITIN, TIBC, IRON, RETICCTPCT in the last 72 hours. Sepsis Labs: No results for input(s): PROCALCITON, LATICACIDVEN in the last 168 hours.  Recent Results (from the past 240 hour(s))  Urine culture     Status: None   Collection Time: 02/14/16  9:41 AM  Result Value Ref Range Status   Specimen Description URINE, CATHETERIZED  Final   Special Requests NONE  Final   Culture NO GROWTH [facility]   Final   Report Status 02/15/2016 FINAL  Final    Culture, blood (routine x 2)     Status: None   Collection Time: 02/14/16  1:52 PM  Result Value Ref Range Status   Specimen Description BLOOD LEFT HAND  Final   Special Requests BOTTLES DRAWN AEROBIC AND ANAEROBIC [facility]7CC EACH  Final   Culture NO GROWTH 6 DAYS  Final   Report Status 02/20/2016 FINAL  Final  Culture, blood (routine x 2)     Status: None   Collection Time: 02/14/16  2:04 PM  Result Value Ref Range Status   Specimen Description BLOOD RIGHT HAND  Final   Special Requests BOTTLES DRAWN AEROBIC ONLY 4CC  Final   Culture NO GROWTH 6 DAYS  Final   Report Status 02/20/2016 FINAL  Final  MRSA PCR Screening     Status: None   Collection Time: 02/14/16  6:00 PM  Result Value Ref Range Status   MRSA by PCR NEGATIVE NEGATIVE Final    Comment:        The GeneXpert MRSA Assay (FDA approved for NASAL specimens only), is one component of a comprehensive MRSA colonization surveillance program. It is not intended to diagnose MRSA infection nor to guide or monitor treatment for MRSA infections.          Radiology Studies: No results found.      Scheduled Meds: . enoxaparin (LOVENOX) injection  40 mg Subcutaneous Q24H  . piperacillin-tazobactam (ZOSYN)  IV  3.375 g Intravenous Q8H  . potassium chloride  40 mEq Oral Once   Continuous Infusions: . 0.9 % NaCl with KCl 40 mEq / L 75 mL/hr (02/23/16 1024)     LOS: 9 days    Time spent: 25 min    Chaya JanHERNANDEZ ACOSTA,ESTELA, MD [facility] Pager [phone]  If 7PM-7AM, please contact night-coverage [email or web address] Password [email or web address] 02/23/2016, 3:52 PM

## 2016-02-23 NOTE — Progress Notes (Signed)
PT Cancellation Note  Patient Details Name: Kendra Campos MRN: [id number] DOB: 01/19/1924   Cancelled Treatment:    Reason Eval/Treat Not Completed: Other (comment) (Per Dr. Ardyth HarpsHernandez, will d/c PT orders at this time due to findings of bowel perforation which is being treated with conservative measures as she is not a surgical candidate.  Pt also continues with AMS with likely transition to comfort measures. )   Beth Ltanya Bayley, PT, DPT X: [phone]

## 2016-02-24 LAB — BASIC METABOLIC PANEL
Anion gap: 9 (ref 5–15)
BUN: 25 mg/dL — AB (ref 6–20)
CHLORIDE: 110 mmol/L (ref 101–111)
CO2: 24 mmol/L (ref 22–32)
CREATININE: 0.91 mg/dL (ref 0.44–1.00)
Calcium: 8.8 mg/dL — ABNORMAL LOW (ref 8.9–10.3)
GFR calc Af Amer: >60 mL/min (ref >60)
GFR calc non Af Amer: 54 mL/min — ABNORMAL LOW (ref >60)
GLUCOSE: 90 mg/dL (ref 65–99)
Potassium: 4.9 mmol/L (ref 3.5–5.1)
SODIUM: 143 mmol/L (ref 135–145)

## 2016-02-24 LAB — CBC
HCT: 35.6 % — ABNORMAL LOW (ref 36.0–46.0)
HEMOGLOBIN: 11.1 g/dL — AB (ref 12.0–15.0)
MCH: 27.3 pg (ref 26.0–34.0)
MCHC: 31.2 g/dL (ref 30.0–36.0)
MCV: 87.7 fL (ref 78.0–100.0)
PLATELETS: 330 10*3/uL (ref 150–400)
RBC: 4.06 MIL/uL (ref 3.87–5.11)
RDW: 15.8 % — ABNORMAL HIGH (ref 11.5–15.5)
WBC: 19.5 10*3/uL — ABNORMAL HIGH (ref 4.0–10.5)

## 2016-02-24 MED ORDER — LORAZEPAM 1 MG PO TABS
1.0000 mg | ORAL_TABLET | ORAL | 0 refills | Status: AC | PRN
Start: 2016-02-24 — End: ?

## 2016-02-24 MED ORDER — MORPHINE SULFATE (CONCENTRATE) 10 MG /0.5 ML PO SOLN: 2.5000 mg | ORAL | 0 refills | Status: AC | PRN

## 2016-02-24 NOTE — Progress Notes (Signed)
Daily Progress Note   Patient Name: Kendra Campos       Date: 02/24/2016 DOB: [birth year]  Age: 82 y.o. MRN#: [id number] Attending Physician: Memory Argue* Primary Care Physician: Colette Ribas, MD Admit Date: 02/14/2016  Reason for Consultation/Follow-up: Disposition, Establishing goals of care, Hospice Evaluation and Psychosocial/spiritual support  Subjective: Kendra Campos is lying in bed, I hear her moaning from the doorway. No family of bedside at this time. Kendra Campos will open eyes to command briefly, but not make eye contact. She states repeatedly "leave me alone". There's been no improvement and her white blood cell count, and she is experiencing low grade fever 99.3 axillary. Call to daughter [name] at home number, voicemail message left on recorder, call also to cell phone, also voicemail message left. Conference with Dr. Ardyth Harps who states that Mrs. Leske's daughter [name] is agreeable to transfer to the hospice home.  Referrals made inpatient accepted. Transfer expected within the next few hours.  Length of Stay: 10  Current Medications: Scheduled Meds:  . enoxaparin (LOVENOX) injection  40 mg Subcutaneous Q24H  . piperacillin-tazobactam (ZOSYN)  IV  3.375 g Intravenous Q8H  . potassium chloride  40 mEq Oral Once    Continuous Infusions: . 0.9 % NaCl with KCl 40 mEq / L 75 mL/hr (02/23/16 1024)    PRN Meds: albuterol, morphine injection  Physical Exam  Constitutional: No distress.  Opens eyes briefly to command, but no eye contact.  HENT:  Head: Normocephalic and atraumatic.  Cardiovascular:  Rate 90s to 100s  Pulmonary/Chest: Effort normal. No respiratory distress.  Abdominal: She exhibits distension.  Neurological:    Lethargic, moaning  Nursing note and vitals reviewed.           Vital Signs: BP (!) 169/90 (BP Location: Right Arm)   Pulse (!) 106   Temp 99.3 F (37.4 C) (Axillary)   Resp 16   Ht 5\' 4"  (1.626 m)   Wt 88.1 kg (194 lb 3.6 oz)   SpO2 97%   BMI 33.34 kg/m  SpO2: SpO2: 97 % O2 Device: O2 Device: Not Delivered O2 Flow Rate:    Intake/output summary:  Intake/Output Summary (Last 24 hours) at 02/24/16 1219 Last data filed at 02/23/16 1832  Gross per 24 hour  Intake  847.5 ml  Output                1 ml  Net            846.5 ml   LBM: Last BM Date: 02/20/16 Baseline Weight: Weight: 88.5 kg (195 lb) Most recent weight: Weight: 88.1 kg (194 lb 3.6 oz)       Palliative Assessment/Data:    Flowsheet Rows   Flowsheet Row Most Recent Value  Intake Tab  Referral Department  Surgery  Unit at Time of Referral  Med/Surg Unit  Palliative Care Primary Diagnosis  Sepsis/Infectious Disease  Date Notified  02/22/16  Palliative Care Type  New Palliative care  Reason for referral  Psychosocial or Spiritual support  Date of Admission  02/14/16  Date first seen by Palliative Care  02/22/16  # of days Palliative referral response time  0 Day(s)  # of days IP prior to Palliative referral  8  Clinical Assessment  Palliative Performance Scale Score  20%  Pain Max last 24 hours  Not able to report  Pain Min Last 24 hours  Not able to report  Dyspnea Max Last 24 Hours  Not able to report  Dyspnea Min Last 24 hours  Not able to report  Psychosocial & Spiritual Assessment  Palliative Care Outcomes  Patient/Family meeting held?  No  Palliative Care Outcomes  Provided psychosocial or spiritual support  Patient/Family wishes: Interventions discontinued/not started   Mechanical Ventilation  Palliative Care follow-up planned  -- [Follow-up while at [facility]]      Patient Active Problem List   Diagnosis Date Noted  . Palliative care encounter   . Goals of care, counseling/discussion    . Bowel perforation (HCC) 02/21/2016  . Altered mental state 02/14/2016  . Community acquired pneumonia 02/14/2016  . Lower extremity edema 02/14/2016  . Debility 02/14/2016  . Pelvic pressure in female 07/15/2015  . Pelvic pain in female 07/15/2015  . Essential hypertension 08/07/2013  . Hyperlipidemia 08/07/2013  . GERD (gastroesophageal reflux disease) 06/09/2012    Palliative Care Assessment & Plan   Patient Profile: 82 y.o. female  with past medical history of Thyroid disease, diabetes, hypertension and high cholesterol, arthritis, history of cholecystectomy admitted on 02/14/2016 with UTI and pneumonia, consequently found to have bowel perforation which is being treated with conservative measures not surgery.   Assessment: As above  Recommendations/Plan:  likely transition to hospice/comfort care today, no improvement after 48 hours.  Goals of Care and Additional Recommendations:  Limitations on Scope of Treatment: likely transition to hospice/comfort care today, no improvement after 48 hours.  Code Status:    Code Status Orders        Start     Ordered   02/14/16 1629  Do not attempt resuscitation (DNR)  Continuous    Question Answer Comment  In the event of cardiac or respiratory ARREST Do not call a "code blue"   In the event of cardiac or respiratory ARREST Do not perform Intubation, CPR, defibrillation or ACLS   In the event of cardiac or respiratory ARREST Use medication by any route, position, wound care, and other measures to relive pain and suffering. May use oxygen, suction and manual treatment of airway obstruction as needed for comfort.      02/14/16 1628    Code Status History    Date Active Date Inactive Code Status Order ID Comments User Context   This patient has a current code status but no historical code  status.       Prognosis:   < 2 weeks, likely days based on perforated bowel, no surgical intervention being offered,  IV antibiotics  not affecting infectious process.  Discharge Planning:  Likely hospice/comfort care today  Care plan was discussed with nursing staff, case manager, social worker, and Dr. Earlene Plateravis.  Thank you for allowing the Palliative Medicine Team to assist in the care of this patient.   Time In: 1005 Time Out: 1045 Total Time 40 Minutes  Prolonged Time Billed  no       Greater than 50%  of this time was spent counseling and coordinating care related to the above assessment and plan.  Katheran Aweasha A Dove, NP  Please contact Palliative Medicine Team phone at [phone] for questions and concerns.

## 2016-02-24 NOTE — Progress Notes (Signed)
[facility] [place].  Called report to [employer]. IV catheter D/C. Patient sent with pt. Pt transferred by EMS.

## 2016-02-24 NOTE — Clinical Social Work Note (Signed)
Patient Information   Patient Name Kendra Campos, Kendra Campos ([id number]) Sex Female DOB 03/09/1924  Room Bed  A317 A317-01  Patient Demographics   Address [address] [place] [address] Phone [phone] [facility](Home) [phone] (Mobile)  Patient Ethnicity & Race   Ethnic Group Patient Race  Not Hispanic or Latino White or Caucasian  Emergency Contact(s)   Name Relation Home Work Mobile  Ross,Betty Daughter [phone]  [phone]  Documents on File    Status Date Received Description  Documents for the Patient  Historic Radiology Documentation Not Received    [place] HIPAA NOTICE OF PRIVACY - Scanned Received 05/19/11 R  [place] E-Signature HIPAA Notice of Privacy Received 07/08/13   [place] E-Signature HIPAA Notice of Privacy Spanish Not Received    Driver's License Not Received    Insurance Card Not Received    Advance Directives/Living Will/HCPOA/POA Not Received    Technical brewerinancial Application Not Received    Insurance Card Received 06/09/12 RCGD  [place] HIPAA NOTICE OF PRIVACY - Scanned Not Received    [place] HIPAA NOTICE OF PRIVACY - Scanned Received 06/09/12 RCGD  Release of Information Received 06/09/12 DPR - RCGD  HIM ROI Authorization Not Received  RCGD--06/09/12 prog note to PCP  AMB Correspondence Not Received  12/13 card [facility]  AMB [facility]ence Not Received  [facility]  Insurance Card Not Received    [employer] [employer] Card     Insurance Card Received 08/07/13 [email or web address]  HIM ROI Authorization  02/11/14   AMB Correspondence  10/13/12 OFFICE NOTE [facility]  Advanced Beneficiary Notice (ABN) Not Received    E-Signature AOB Spanish Not Received    Other Photo ID Not Received    Insurance Card Received 07/15/15 [facility] medicare 2017  [place] HIPAA NOTICE OF PRIVACY - Scanned Received (Deleted) 06/09/12 RCGD  AMB Correspondence (Deleted) 06/17/12 12/13 office note [facility]  AMB Correspondence (Deleted) 10/13/12 OFFICE  NOTE [facility]  Documents for the Encounter  AOB (Assignment of Insurance Benefits) Not Received    E-signature AOB Unable to Obtain 02/14/16   MEDICARE RIGHTS Not Received    E-signature Medicare Rights Unable to Obtain 02/14/16   ED Patient Billing Extract   ED PB Summary  ED Patient Billing Extract   ED Encounter Summary  ED Patient Billing Extract   ED PB Summary  Admission Information   Attending Provider Admitting Provider Admission Type Admission Date/Time  Estela Isaiah BlakesY Hernandez Acosta, MD Maye HidesAlexandria M Ukleja, MD Emergency 02/14/16 [phone]  Discharge Date Hospital Service Auth/Cert Status Service Area   Family Medicine Incomplete [place] SERVICE AREA  Unit Room/Bed Admission Status   AP-DEPT 300 A317/A317-01 Admission (Confirmed)   Admission   Complaint  AMS  Hospital Account   Name Acct ID Class Status Primary Coverage  Kendra Campos, Kendra Campos [id number] Inpatient Open UNITED [employer]ALTHCARE MEDICARE - [facility]      Guarantor Account (for Hospital Account [id number])   Name Relation to Pt Service Area Active? Acct Type  Kendra Campos, Kendra Campos Self CHSA Yes Personal/Family  Address Phone    [address] [place], [place] [address] [phone](H)        Coverage Information (for Hospital Account [id number])   Campos/O Payor/Plan Precert #  [facility]UHC MEDICARE   Subscriber Subscriber #  Kendra Campos, Kendra Campos [id number]  Address Phone  [address] [place], [place] [id number] [phone]

## 2016-02-24 NOTE — Progress Notes (Signed)
SURGICAL PROGRESS NOTE (cpt [phone])  Hospital Day(s): 10.   Post op day(s):  Marland Kitchen.   Interval History: Patient seen and examined, no acute events overnight except family expressed some concern regarding why patient did not get her routinely scheduled pain medication until it was investigated and explained that narcotics were held because the patient was somewhat somnolent at that time. Patient reports mild abdominal pain otherwise controlled and remains somewhat confused, reports flatus + BM, denies fever/chills, CP, or SOB. Family reports patient last night requested chocolate ice cream.  Review of Systems:  Constitutional: denies fever, chills  HEENT: denies cough or congestion  Respiratory: denies any shortness of breath  Cardiovascular: denies chest pain or palpitations  Gastrointestinal: pain as per HPI, denies N/V, diarrhea or constipation  Musculoskeletal: denies pain, decreased motor or sensation  Neurological: denies HA or vision/hearing changes   Vital signs in last 24 hours: [min-max] current  Temp:  [99.1 F (37.3 C)-99.3 F (37.4 C)] 99.3 F (37.4 C) (09/22 0529) Pulse Rate:  [100-106] 106 (09/22 0529) Resp:  [16] 16 (09/22 0529) BP: (168-169)/(72-90) 169/90 (09/22 0529) SpO2:  [92 %-97 %] 97 % (09/22 0529) Weight:  [88.1 kg (194 lb 3.6 oz)] 88.1 kg (194 lb 3.6 oz) (09/22 0529)     Height: 5\' 4"  (162.6 cm) Weight: 88.1 kg (194 lb 3.6 oz) BMI (Calculated): 32.2   Intake/Output this shift:  No intake/output data recorded.   Intake/Output last 2 shifts:  @IOLAST2SHIFTS @   Physical Exam:  Constitutional: overall alert, though intermittently confused with limited communication, cooperative and no distress  HENT: normocephalic without obvious abnormality  Eyes: PERRL, EOM's grossly intact and symmetric  Neuro: CN II - XII grossly intact and symmetric without deficit  Respiratory: breathing non-labored at rest  Cardiovascular: regular rate and sinus rhythm   Gastrointestinal: soft, mildly tender overlying hernia site, and non-distended  Musculoskeletal: UE and LE FROM, no edema or wounds, motor grossly intact    Labs:  CBC:  Lab Results  Component Value Date   WBC 19.5 (H) 02/24/2016   RBC 4.06 02/24/2016   BMP:  Lab Results  Component Value Date   GLUCOSE 90 02/24/2016   CO2 24 02/24/2016   BUN 25 (H) 02/24/2016   CREATININE 0.91 02/24/2016   CREATININE 1.09 10/17/2012   CALCIUM 8.8 (L) 02/24/2016     Imaging studies: No new pertinent imaging studies    Assessment/Plan: (ICD-10's: K66.8, K63.1, K43.9) 82 y.o.femalewith persistently worsened leukocytosis despite broad-spectrum antibiotics for pneumoperitoneum suggesting intestinal perforation associated with large wide fascial defect ventral hernia and progression of chronic abdominal pain without clinical or significant radiographic evidence of bowel obstruction, complicated by recent aspiration pneumonia (prior to this admission), current UTI, and pertinent comorbidities including advanced age, diabetes mellitus, questionable myocardial angina with dyspnea on exertion, HTN, HLD, unspecified thyroid disease, and chronic back pain with osteoarthritis.  - focus care to only patient's comfort  - regular diet prn, unrestricted pain control (routine + prn)  - discharge planning to inpatient hospice facility - please call if any questions  All of the above findings and recommendations were discussed with the patient, patient's family, and the medical team, and all of patient's and family's questions were answered to their expressed satisfaction.  -- Scherrie GerlachJason E. Earlene Plateravis, MD, RPVI : [facility] General [facility] Office: [phone]

## 2016-02-24 NOTE — Clinical Social Work Note (Signed)
After discussion with MD and surgery, family is agreeable to transfer [facility]. Referral faxed and bed is available today. [facility].   Derenda FennelKara Lanita Stammen, LCSW [phone]

## 2016-02-24 NOTE — Discharge Summary (Addendum)
Physician Discharge Summary  Kendra Campos WUJ:[id number] DOB: 08-26-1923 DOA: 02/14/2016  PCP: Colette Ribas, MD  Admit date: 02/14/2016 Discharge date: 02/24/2016  Time spent: 45 minutes  Recommendations for Outpatient Follow-up:  -Will be discharged today to residential hospice for end of life care.   Discharge Diagnoses:  Principal Problem:   Altered mental state Active Problems:   GERD (gastroesophageal reflux disease)   Essential hypertension   Community acquired pneumonia   Lower extremity edema   Debility   Bowel perforation [facility])   Palliative care encounter   Goals of care, counseling/discussion   Discharge Condition: Guarded  Filed Weights   02/21/16 0625 02/23/16 0503 02/24/16 0529  Weight: 84.2 kg (185 lb 10 oz) 87.1 kg (192 lb 0.3 oz) 88.1 kg (194 lb 3.6 oz)    History of present illness:  As per Dr. Lawson Radar on 9/12: 82 year old female presents from an assisted living facility for decline in mental status for the past 7 days.  Per patient's daughter, whom the history was elicited from as patient was not responsive to questioning, patient began to decline since April when she was first brought to the assisted living.  For example, patient used to be ambulatory and now is wheelchair bound.  Over the past week patient has declined even more, eating little, not feeding herself and seeming more lethargic than before.  Patient's daughter also states that patient began developing swelling of her lower extremities recently and she believes she was placed on a water pill but couldn't state when exactly that was.  Patient has a history of recurrent UTI's for which patient was just treated with Cipro and was about a day away from finishing the course.  Patient's daughter states normally patient is able to converse and is alert and oriented.  Patient was unable to provide ROS.  In the emergency department patient was seen and evaluated by Dr. Effie Shy.  Family was  bedside but left just prior to my evaluation.  She was on room air and was breathing normally.  She did not appear in pain.  She was able to be aroused and responded to name.  She was not able to speak coherently.  Hospital Course:   1. Bowel perforation. -With lack of improvement with conservative management and not being a surgical candidate, patient has been transitioned to comfort care and will be discharged to residential hospice today.  2.  Aspiration pneumonia Presumably developed in the hospital 3.  Urinary tract infection 4.  Acute encephalopathy 5.  Hypothyroidism 6.  Hypokalemia 7. Acute on chronic systolic congestive heart failure. 8.  Functional decline/failure to thrive    Procedures:  None   Consultations:  Surgery  Palliative Care  Discharge Instructions     Medication List    STOP taking these medications   busPIRone 7.5 MG tablet Commonly known as:  BUSPAR   ciprofloxacin 500 MG tablet Commonly known as:  CIPRO   Cranberry Juice Powder 425 MG Caps   furosemide 20 MG tablet Commonly known as:  LASIX   HYDROcodone-acetaminophen 5-325 MG tablet Commonly known as:  NORCO/VICODIN   levothyroxine 25 MCG tablet Commonly known as:  SYNTHROID, LEVOTHROID   nystatin-triamcinolone cream Commonly known as:  MYCOLOG II   polyethylene glycol packet Commonly known as:  MIRALAX / GLYCOLAX   potassium chloride 10 MEQ tablet Commonly known as:  K-DUR   ranitidine 150 MG tablet Commonly known as:  ZANTAC   sertraline 25 MG tablet  Commonly known as:  ZOLOFT   traZODone 50 MG tablet Commonly known as:  DESYREL   trimethoprim 100 MG tablet Commonly known as:  TRIMPEX     TAKE these medications   LORazepam 1 MG tablet Commonly known as:  ATIVAN Take 1 tablet (1 mg total) by mouth every 2 (two) hours as needed for anxiety. What changed:  medication strength  how much to take  when to take this  reasons to take this   morphine  CONCENTRATE 10 mg / 0.5 ml concentrated solution Take 0.13 mLs (2.6 mg total) by mouth every 3 (three) hours as needed for severe pain.      No Known Allergies    The results of significant diagnostics from this hospitalization (including imaging, microbiology, ancillary and laboratory) are listed below for reference.    Significant Diagnostic Studies: Dg Chest 1 View  Result Date: 02/18/2016 CLINICAL DATA:  Fever- possible aspiration, hx of recent UTI EXAM: CHEST 1 VIEW COMPARISON:  Radiograph 02/14/2016 FINDINGS: Stable cardiac silhouette. There is new RIGHT effusion with potential RIGHT lower lobe airspace disease. Upper lungs are clear. IMPRESSION: New RIGHT effusion with potential aspiration or pneumonia in the RIGHT lower lobe. Electronically Signed   By: Genevive Bi M.D.   On: 02/18/2016 13:26   Dg Chest 2 View  Result Date: 02/14/2016 CLINICAL DATA:  Altered mental status, history of hypertension, hyperlipidemia. EXAM: CHEST  2 VIEW COMPARISON:  Portable chest x-ray of January 03, 2016 FINDINGS: The lungs are reasonably well inflated. The right hemidiaphragm is higher than the left. There is density in the posterior aspect of the right hemi thorax that suggest pleural fluid. Infiltrate here is not excluded. The cardiac silhouette is mildly enlarged though stable. The central pulmonary vascularity is prominent. No cephalization of the vascular pattern is observed. There is dense calcification in the wall of the thoracic aorta. There degenerative changes of both shoulders. There is multilevel degenerative disc disease of the thoracic spine. IMPRESSION: Chronic bronchitic changes. Right lower lobe atelectasis or pneumonia with small right pleural effusion. Followup PA and lateral chest X-ray is recommended in 3-4 weeks following trial of antibiotic therapy to ensure resolution and exclude underlying malignancy. Cardiomegaly without definite pulmonary edema. Aortic atherosclerosis.  Electronically Signed   By: David  [place] M.D.   On: 02/14/2016 10:40   Ct Head Wo Contrast  Result Date: 02/14/2016 CLINICAL DATA:  Altered mental status for 1 day, worse today EXAM: CT HEAD WITHOUT CONTRAST TECHNIQUE: Contiguous axial images were obtained from the base of the skull through the vertex without intravenous contrast. COMPARISON:  None. FINDINGS: Brain: The ventricular system is prominent as are the cortical sulci, indicative of diffuse atrophy. Moderate small vessel ischemic change is noted throughout the periventricular white matter. No hemorrhage, mass lesion, or acute infarction is seen. Vascular: On this unenhanced study, no vascular abnormality is seen. Skull: No calvarial abnormality is noted. Sinuses/Orbits: Paranasal sinuses are pneumatized. Other: None IMPRESSION: Atrophy and moderate small vessel ischemic change. No acute intracranial abnormality. Electronically Signed   By: Dwyane Dee M.D.   On: 02/14/2016 11:25   Ct Abdomen Pelvis W Contrast  Result Date: 02/21/2016 CLINICAL DATA:  Abdominal pain. Gradual functional decline since April with severe decline the last 24 hours. EXAM: CT ABDOMEN AND PELVIS WITH CONTRAST TECHNIQUE: Multidetector CT imaging of the abdomen and pelvis was performed using the standard protocol following bolus administration of intravenous contrast. CONTRAST:  ISOVUE-300 IOPAMIDOL (ISOVUE-300) INJECTION 61% COMPARISON:  CT 07/27/2005  FINDINGS: Lower chest: Small bilateral pleural effusions. Dependent atelectasis in the lower lobes. Cardiomegaly. Hepatobiliary: Prior cholecystectomy. No focal hepatic abnormality. Subtle nodularity to the liver contours. Pancreas: Diffuse pancreatic atrophy. No focal abnormality or ductal dilatation. Spleen: No focal abnormality.  Normal size. Adrenals/Urinary Tract: Gas is noted within the bladder, presumably from recent catheterization. Recommend clinical correlation. No hydronephrosis or renal mass. Adrenal glands  unremarkable. Stomach/Bowel: There is a midline ventral hernia. There is extensive gas within the hernia sac, likely within strangulated small bowel loops. Some of this may be free air is well. There is free air in the abdomen anterior to the liver. Mildly prominent small bowel loops in the lower abdomen and upper pelvis may be related to early small bowel obstruction. Left colonic diverticulosis. Stomach is distended with gas and fluid. Vascular/Lymphatic: Dense vascular calcifications throughout the aorta and iliac vessels. No aneurysm. No adenopathy. Reproductive: Uterus and adnexa unremarkable.  No mass. Other: Small to moderate free fluid in the pelvis. Free air noted anteriorly within the abdomen overlying the liver as well as within the ventral hernia sac. Musculoskeletal: No acute bony abnormality or focal bone lesion. Diffuse degenerative disc and facet disease throughout the lumbar spine. IMPRESSION: Free air in the abdomen and within a large ventral hernia which likely is due to strangulated small bowel in the ventral hernia and bowel perforation. Mildly prominent small bowel loops which could be related to partial small bowel obstruction, but there is also gaseous distention of the stomach and to a lesser extent colon. Findings may be reflective of ileus. Left colonic diverticulosis. Extensive aortoiliac atherosclerosis. Small bilateral pleural effusions with dependent atelectasis. Critical Value/emergent results were called by telephone at the time of interpretation on 02/21/2016 at 10:26 am to Dr. Jeralyn Bennett , who verbally acknowledged these results. Electronically Signed   By: Charlett Nose M.D.   On: 02/21/2016 10:26    Microbiology: Recent Results (from the past 240 hour(s))  Culture, blood (routine x 2)     Status: None   Collection Time: 02/14/16  1:52 PM  Result Value Ref Range Status   Specimen Description BLOOD LEFT HAND  Final   Special Requests BOTTLES DRAWN AEROBIC AND ANAEROBIC  [facility] EACH  Final   Culture NO GROWTH 6 DAYS  Final   Report Status 02/20/2016 FINAL  Final  Culture, blood (routine x 2)     Status: None   Collection Time: 02/14/16  2:04 PM  Result Value Ref Range Status   Specimen Description BLOOD RIGHT HAND  Final   Special Requests BOTTLES DRAWN AEROBIC ONLY 4CC  Final   Culture NO GROWTH 6 DAYS  Final   Report Status 02/20/2016 FINAL  Final  MRSA PCR Screening     Status: None   Collection Time: 02/14/16  6:00 PM  Result Value Ref Range Status   MRSA by PCR NEGATIVE NEGATIVE Final    Comment:        The GeneXpert MRSA Assay (FDA approved for NASAL specimens only), is one component of a comprehensive MRSA colonization surveillance program. It is not intended to diagnose MRSA infection nor to guide or monitor treatment for MRSA infections.      Labs: Basic Metabolic Panel:  Recent Labs Lab 02/19/16 0718 02/20/16 0637 02/21/16 0617 02/23/16 0557 02/24/16 0554  NA 133* 134* 136 140 143  K 3.5 3.3* 3.3* 4.9 4.9  CL 95* 99* 98* 108 110  CO2 26 27 28 24 24   GLUCOSE 93 88 104* 79  90  BUN 17 22* 22* 27* 25*  CREATININE 0.89 0.82 0.83  0.89 1.01* 0.91  CALCIUM 8.4* 8.3* 8.4* 8.7* 8.8*   Liver Function Tests: No results for input(s): AST, ALT, ALKPHOS, BILITOT, PROT, ALBUMIN in the last 168 hours. No results for input(s): LIPASE, AMYLASE in the last 168 hours. No results for input(s): AMMONIA in the last 168 hours. CBC:  Recent Labs Lab 02/19/16 0718 02/20/16 0637 02/21/16 0617 02/23/16 0557 02/24/16 0554  WBC 13.2* 13.1* 14.2* 19.1* 19.5*  HGB 11.1* 11.8* 11.7* 11.1* 11.1*  HCT 33.6* 34.9* 36.1 35.5* 35.6*  MCV 85.5 84.9 85.7 87.4 87.7  PLT 220 277 287 311 330   Cardiac Enzymes: No results for input(s): CKTOTAL, CKMB, CKMBINDEX, TROPONINI in the last 168 hours. BNP: BNP (last 3 results)  Recent Labs  01/03/16 0358  BNP 346.0*    ProBNP (last 3 results) No results for input(s): PROBNP in the last 8760  hours.  CBG: No results for input(s): GLUCAP in the last 168 hours.     SignedChaya Jan:  HERNANDEZ ACOSTA,[place]  [facility] Pager: [phone] 02/24/2016, 12:30 PM

## 2016-03-04 DEATH — deceased

## 2018-07-21 IMAGING — CR DG CHEST 1V PORT
1 series · 1 of 1 positions shown · non-contrast
Comparison: None.

CLINICAL DATA: Shortness of breath, back pain, bilateral lower
extremity pain tonight. No known injury.

EXAM:
PORTABLE CHEST 1 VIEW

[ap]
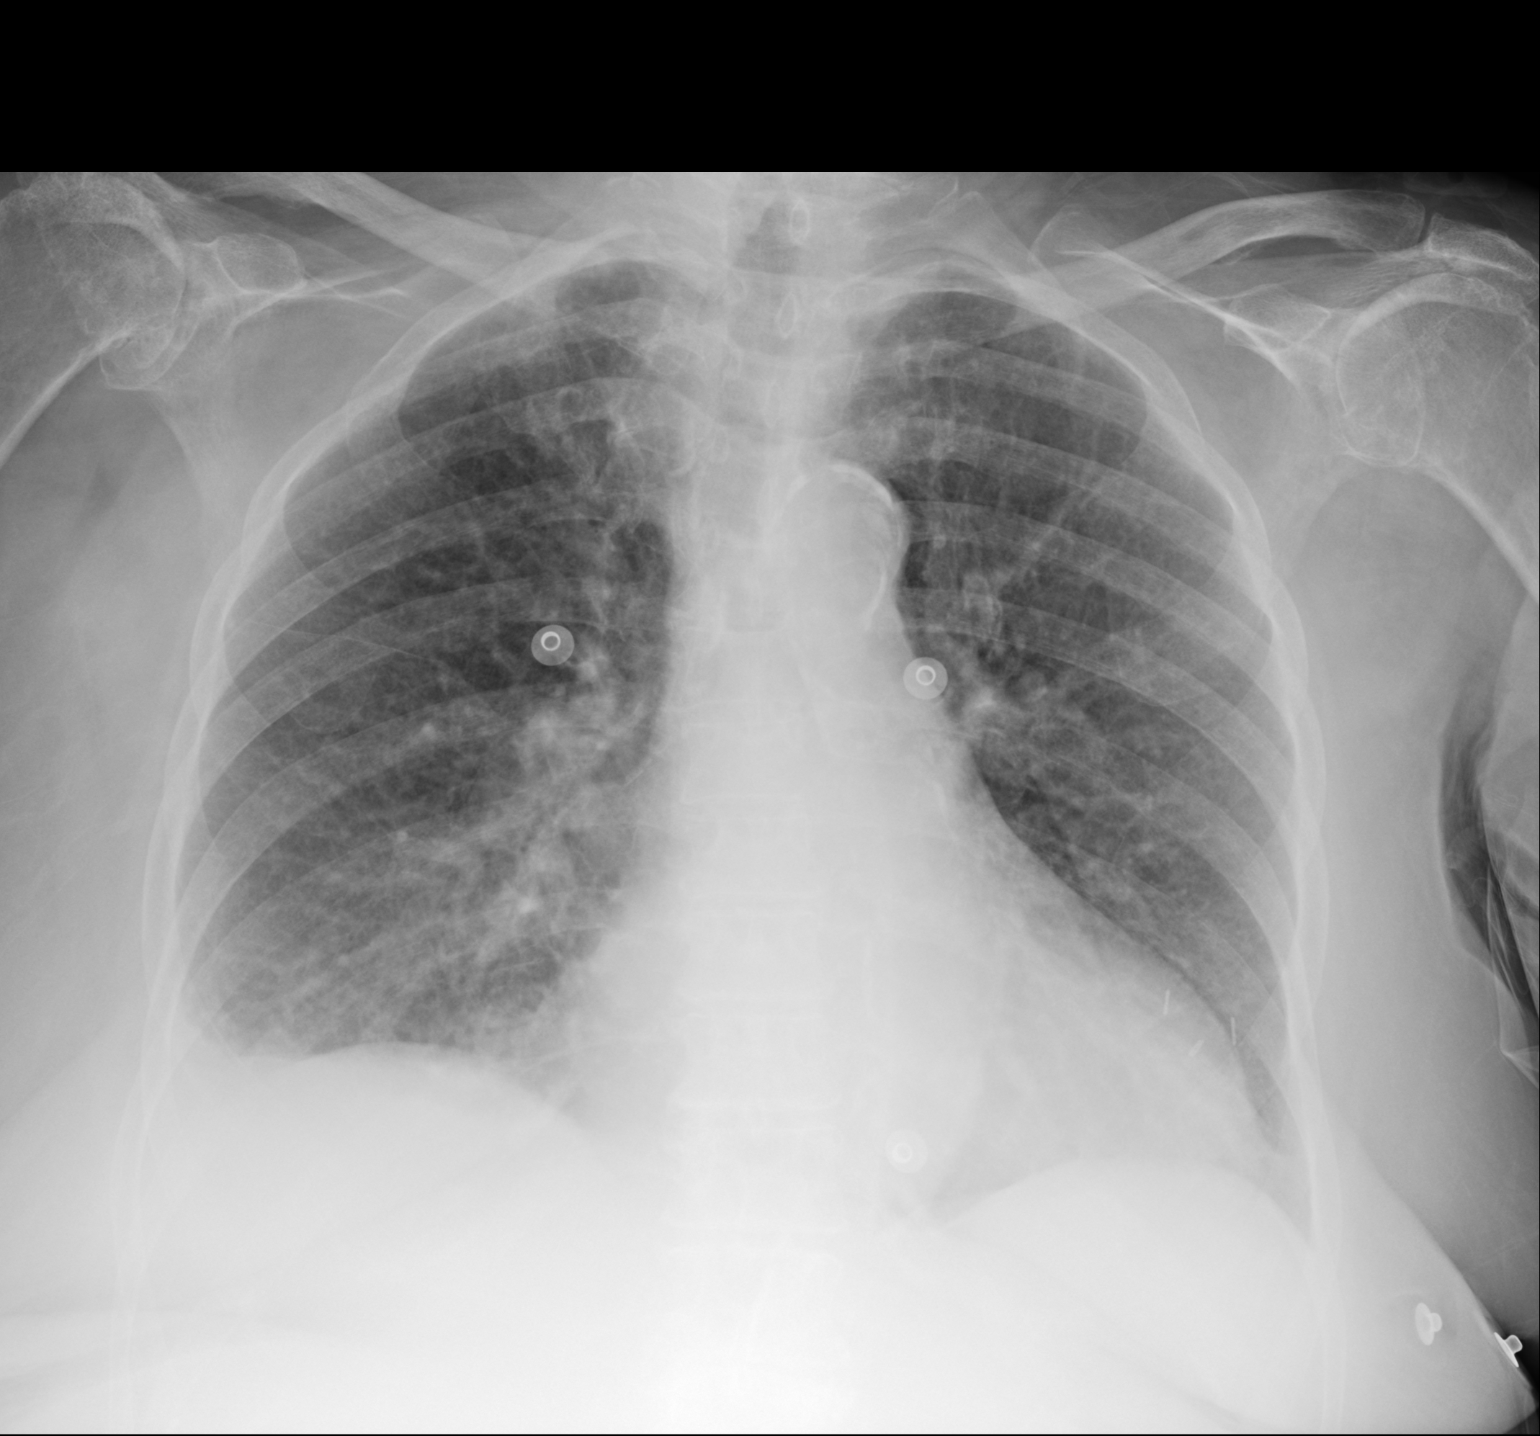

[1 of 1 positions shown; findings below may reference images not displayed]

FINDINGS: Mild cardiomegaly. Tortuous thoracic aorta with atherosclerosis.
Cephalization of pulmonary vasculature with peribronchial cuffing
suggesting pulmonary edema. Small right pleural effusion. No
pneumothorax or focal airspace opacity. Surgical clips project over
the left hemithorax. Advanced degenerative change of the right
shoulder.
IMPRESSION: 1. CHF pattern with cardiomegaly, pulmonary edema and right pleural
effusion.
2. Thoracic aortic atherosclerosis.

## 2018-09-08 IMAGING — CT CT ABD-PELV W/ CM
2 of 4 series · 16 of 46 positions shown, 18 images · IV contrast (APPLIED)
Comparison: CT 07/27/2005

CLINICAL DATA: Abdominal pain. Gradual functional decline since
[place] with severe decline the last 24 hours.

EXAM:
CT ABDOMEN AND PELVIS WITH CONTRAST
TECHNIQUE: Multidetector CT imaging of the abdomen and pelvis was performed
using the standard protocol following bolus administration of
intravenous contrast.
CONTRAST:  100mL HRVVFQ-000 IOPAMIDOL (HRVVFQ-000) INJECTION 61%

[Series 4: axial st · axial · 0.88mm/px · z∈[-15,+385]mm · 13 of 88 slices shown, 15 images]
[im 4/88  soft-tissue]
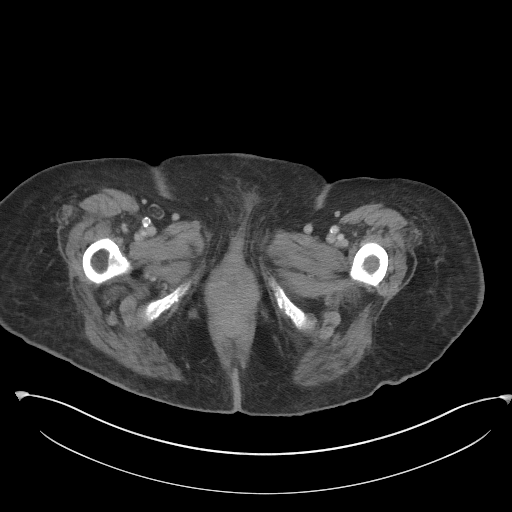
[im 4/88  bone]
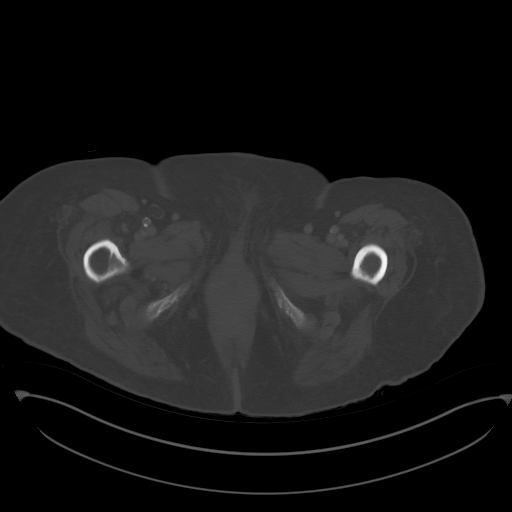
[im 12/88  soft-tissue]
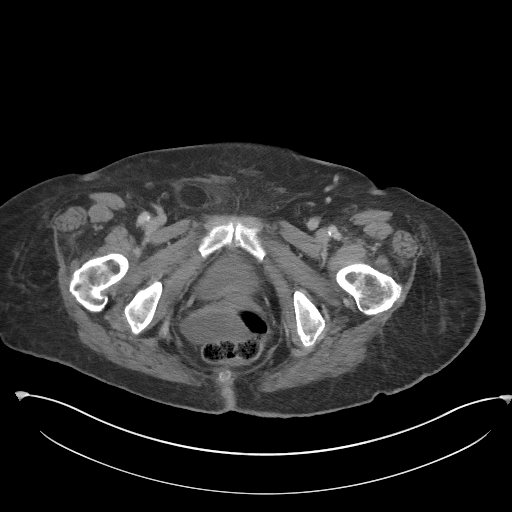
[im 20/88  soft-tissue]
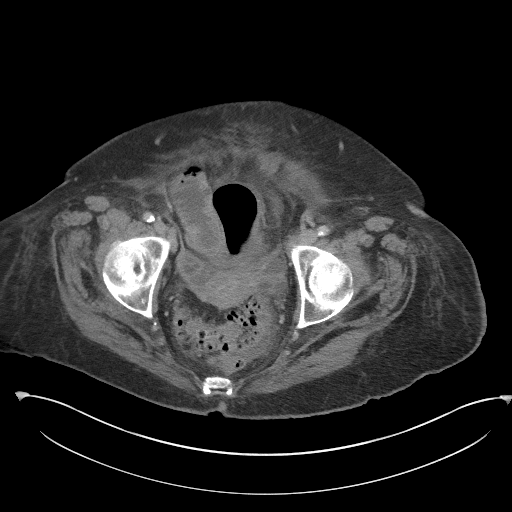
[im 24/88  soft-tissue]
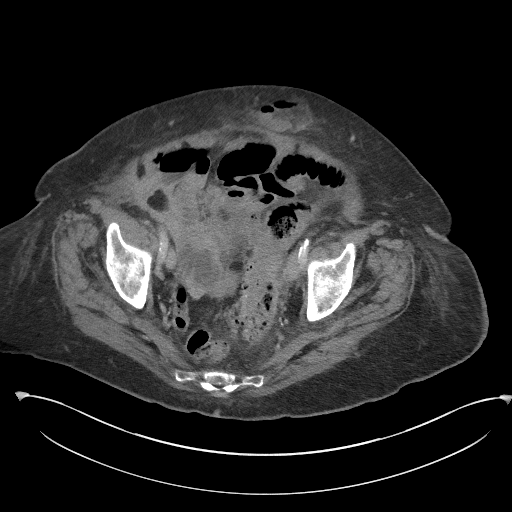
[im 32/88  soft-tissue]
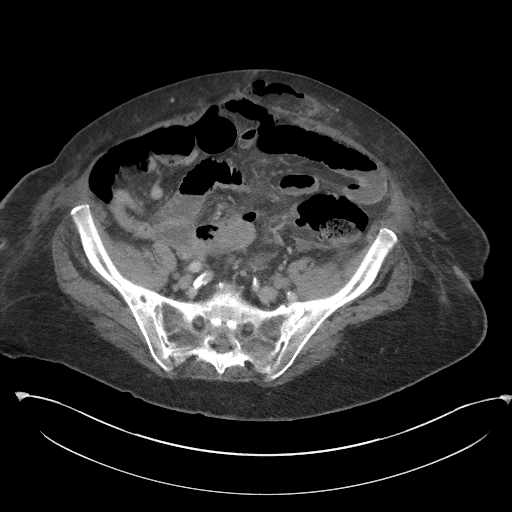
[im 36/88  soft-tissue]
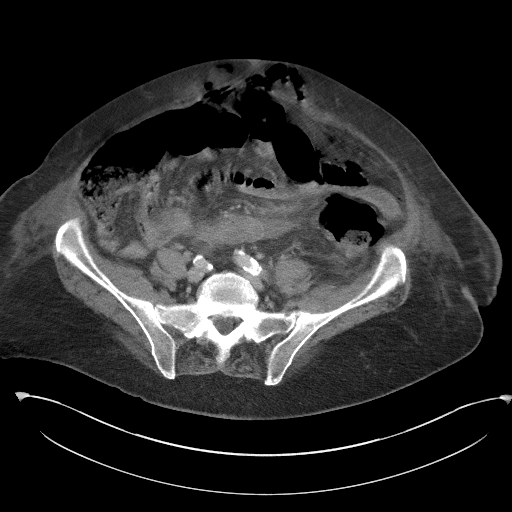
[im 44/88  soft-tissue]
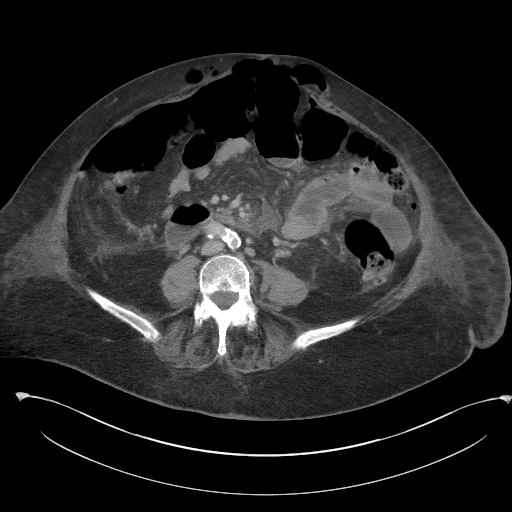
[im 52/88  soft-tissue]
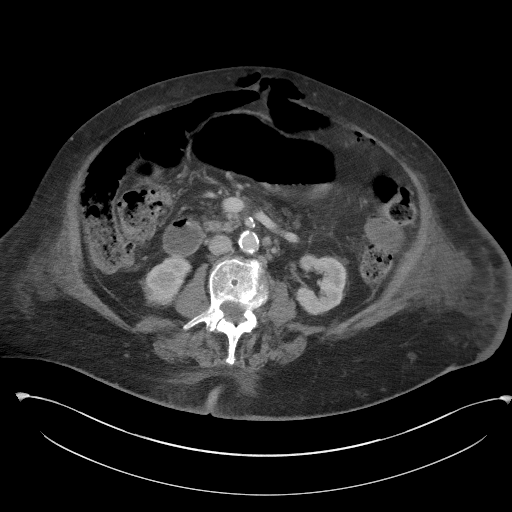
[im 56/88  soft-tissue]
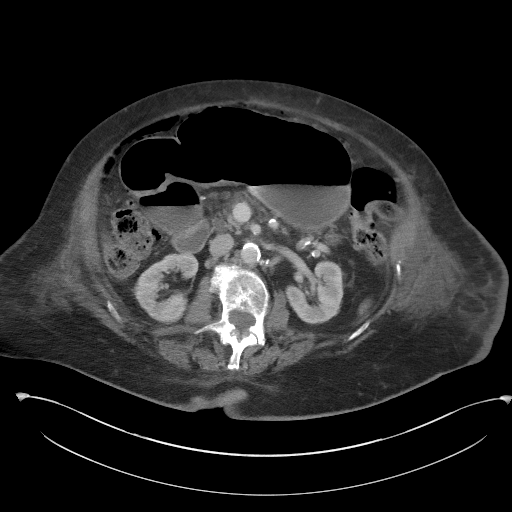
[im 56/88  bone]
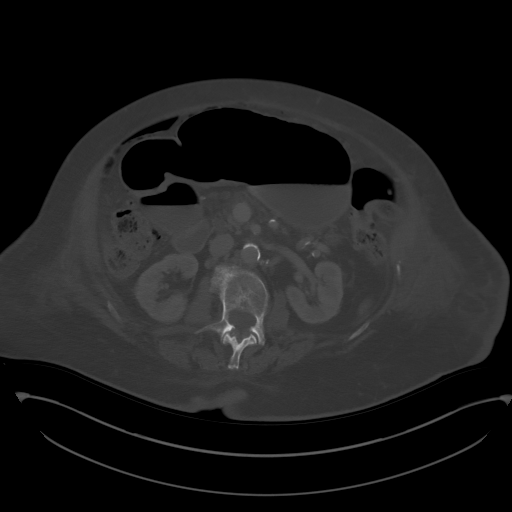
[im 64/88  soft-tissue]
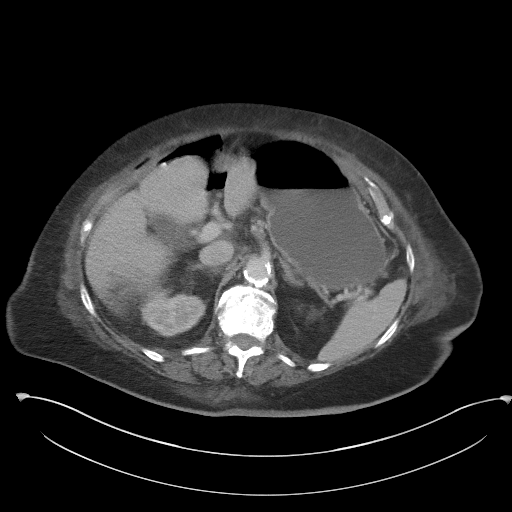
[im 68/88  soft-tissue]
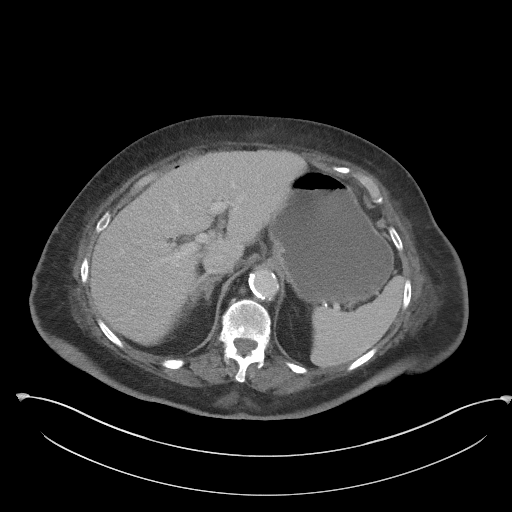
[im 76/88  soft-tissue]
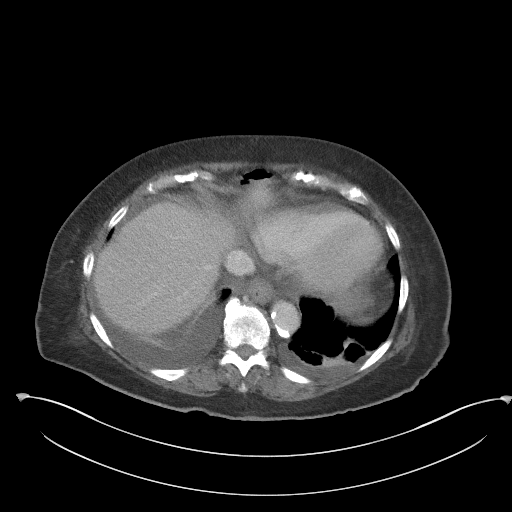
[im 84/88  soft-tissue]
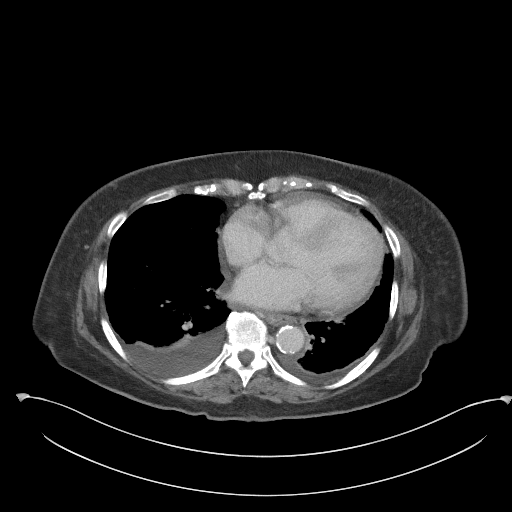

[Series 6: coronal st · coronal · 0.81mm/px · 3 of 108 slices shown]
[im 36/108  soft-tissue]
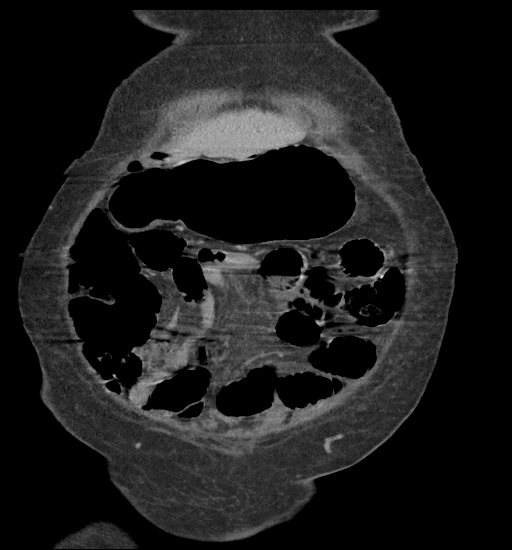
[im 48/108  soft-tissue]
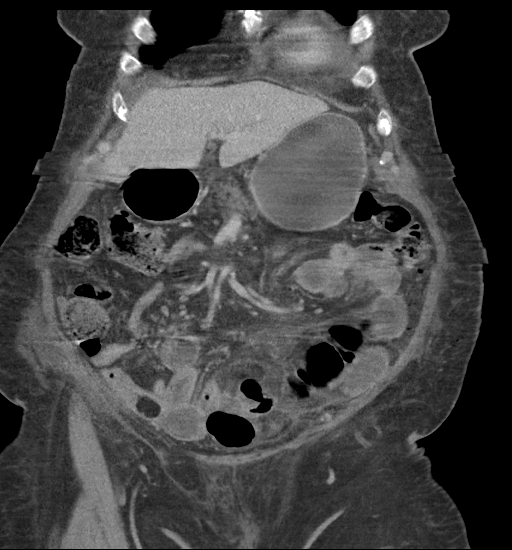
[im 60/108  soft-tissue]
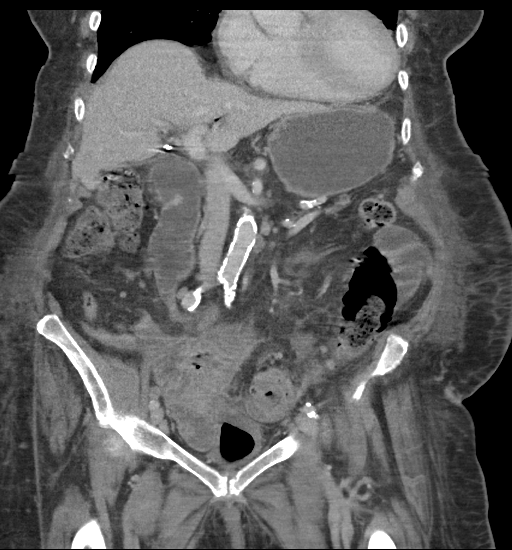

[16 of 46 positions shown; findings below may reference images not displayed]

FINDINGS: Lower chest: Small bilateral pleural effusions. Dependent
atelectasis in the lower lobes. Cardiomegaly.

Hepatobiliary: Prior cholecystectomy. No focal hepatic abnormality.
Subtle nodularity to the liver contours.

Pancreas: Diffuse pancreatic atrophy. No focal abnormality or ductal
dilatation.

Spleen: No focal abnormality.  Normal size.

Adrenals/Urinary Tract: Gas is noted within the bladder, presumably
from recent catheterization. Recommend clinical correlation. No
hydronephrosis or renal mass. Adrenal glands unremarkable.

Stomach/Bowel: There is a midline ventral hernia. There is extensive
gas within the hernia sac, likely within strangulated small bowel
loops. Some of this may be free air is well. There is free air in
the abdomen anterior to the liver. Mildly prominent small bowel
loops in the lower abdomen and upper pelvis may be related to early
small bowel obstruction. Left colonic diverticulosis. Stomach is
distended with gas and fluid.

Vascular/Lymphatic: Dense vascular calcifications throughout the
aorta and iliac vessels. No aneurysm. No adenopathy.

Reproductive: Uterus and adnexa unremarkable.  No mass.

Other: Small to moderate free fluid in the pelvis. Free air noted
anteriorly within the abdomen overlying the liver as well as within
the ventral hernia sac.

Musculoskeletal: No acute bony abnormality or focal bone lesion.
Diffuse degenerative disc and facet disease throughout the lumbar
spine.
IMPRESSION: Free air in the abdomen and within a large ventral hernia which
likely is due to strangulated small bowel in the ventral hernia and
bowel perforation.

Mildly prominent small bowel loops which could be related to partial
small bowel obstruction, but there is also gaseous distention of the
stomach and to a lesser extent colon. Findings may be reflective of
ileus.

Left colonic diverticulosis.

Extensive aortoiliac atherosclerosis.

Small bilateral pleural effusions with dependent atelectasis.

Critical Value/emergent results were called by telephone at the time
of interpretation on 02/21/2016 at [DATE] to Dr. REJEANNE VAN BEEK ,
who verbally acknowledged these results.
